# Patient Record
Sex: Male | Born: 1965 | Race: White | Hispanic: No | Smoking: Never smoker
Health system: Southern US, Community
[De-identification: ages and names within clinical notes are randomized; demographics above are authoritative.]

## PROBLEM LIST (undated history)

## (undated) DIAGNOSIS — Z9861 Coronary angioplasty status: Secondary | ICD-10-CM

## (undated) DIAGNOSIS — I340 Nonrheumatic mitral (valve) insufficiency: Secondary | ICD-10-CM

## (undated) DIAGNOSIS — K219 Gastro-esophageal reflux disease without esophagitis: Secondary | ICD-10-CM

## (undated) DIAGNOSIS — I2510 Atherosclerotic heart disease of native coronary artery without angina pectoris: Secondary | ICD-10-CM

## (undated) DIAGNOSIS — M109 Gout, unspecified: Secondary | ICD-10-CM

## (undated) DIAGNOSIS — E785 Hyperlipidemia, unspecified: Secondary | ICD-10-CM

## (undated) HISTORY — DX: Hyperlipidemia, unspecified: E78.5

## (undated) HISTORY — DX: Gout, unspecified: M10.9

## (undated) HISTORY — DX: Gastro-esophageal reflux disease without esophagitis: K21.9

---

## 2010-08-09 ENCOUNTER — Ambulatory Visit: Payer: Self-pay | Admitting: Family Medicine

## 2012-03-29 ENCOUNTER — Ambulatory Visit: Payer: Self-pay | Admitting: Family Medicine

## 2014-11-07 ENCOUNTER — Other Ambulatory Visit: Payer: Self-pay | Admitting: Family Medicine

## 2014-11-07 DIAGNOSIS — K219 Gastro-esophageal reflux disease without esophagitis: Secondary | ICD-10-CM

## 2014-12-24 ENCOUNTER — Ambulatory Visit (INDEPENDENT_AMBULATORY_CARE_PROVIDER_SITE_OTHER): Payer: BLUE CROSS/BLUE SHIELD | Admitting: Family Medicine

## 2014-12-24 ENCOUNTER — Encounter: Payer: Self-pay | Admitting: Family Medicine

## 2014-12-24 VITALS — BP 130/62 | HR 72 | Ht 74.0 in | Wt 242.0 lb

## 2014-12-24 DIAGNOSIS — M10071 Idiopathic gout, right ankle and foot: Secondary | ICD-10-CM

## 2014-12-24 MED ORDER — INDOMETHACIN 50 MG PO CAPS: 50.0000 mg | ORAL_CAPSULE | Freq: Three times a day (TID) | ORAL | Status: DC | PRN

## 2014-12-24 NOTE — Patient Instructions (Signed)

## 2014-12-24 NOTE — Progress Notes (Signed)
Name: Rodney Briggs   MRN: [id number]    DOB: Jul 21, 1965   Date:12/24/2014       Progress Note  Subjective  Chief Complaint  Chief Complaint  Patient presents with  . Gout    R) foot    Leg Pain  The incident occurred more than 1 week ago. There was no injury mechanism. The pain is present in the right foot. The quality of the pain is described as aching. The pain is at a severity of 5/10. The pain is moderate. The pain has been fluctuating since onset. Pertinent negatives include no inability to bear weight, loss of motion, loss of sensation, muscle weakness, numbness or tingling. The symptoms are aggravated by weight bearing. He has tried NSAIDs (started Monday) for the symptoms. The treatment provided moderate relief.    No problem-specific assessment & plan notes found for this encounter.   Past Medical History  Diagnosis Date  . GERD (gastroesophageal reflux disease)   . Gout     History reviewed. No pertinent past surgical history.  Family History  Problem Relation Age of Onset  . Heart disease Mother   . Stroke Mother   . Heart disease Father     Social History   Social History  . Marital Status: Married    Spouse Name: N/A  . Number of Children: N/A  . Years of Education: N/A   Occupational History  . Not on file.   Social History Main Topics  . Smoking status: Never Smoker   . Smokeless tobacco: Not on file  . Alcohol Use: 0.0 oz/week    0 Standard drinks or equivalent per week  . Drug Use: No  . Sexual Activity: Yes   Other Topics Concern  . Not on file   Social History Narrative  . No narrative on file    No Known Allergies   Review of Systems  Constitutional: Negative for fever, chills, weight loss and malaise/fatigue.  HENT: Negative for ear discharge, ear pain and sore throat.   Eyes: Negative for blurred vision.  Respiratory: Negative for cough, sputum production, shortness of breath and wheezing.   Cardiovascular: Negative for chest  pain, palpitations and leg swelling.  Gastrointestinal: Negative for heartburn, nausea, abdominal pain, diarrhea, constipation, blood in stool and melena.  Genitourinary: Negative for dysuria, urgency, frequency and hematuria.  Musculoskeletal: Negative for myalgias, back pain, joint pain and neck pain.  Skin: Negative for rash.  Neurological: Negative for dizziness, tingling, sensory change, focal weakness, numbness and headaches.  Endo/Heme/Allergies: Negative for environmental allergies and polydipsia. Does not bruise/bleed easily.  Psychiatric/Behavioral: Negative for depression and suicidal ideas. The patient is not nervous/anxious and does not have insomnia.      Objective  Filed Vitals:   12/24/14 1030  BP: 130/62  Pulse: 72  Height: 6\' 2"  (1.88 m)  Weight: 242 lb (109.77 kg)    Physical Exam  Constitutional: He is oriented to person, place, and time and well-developed, well-nourished, and in no distress.  HENT:  Head: Normocephalic.  Right Ear: External ear normal.  Left Ear: External ear normal.  Nose: Nose normal.  Mouth/Throat: Oropharynx is clear and moist.  Eyes: Conjunctivae and EOM are normal. Pupils are equal, round, and reactive to light. Right eye exhibits no discharge. Left eye exhibits no discharge. No scleral icterus.  Neck: Normal range of motion. Neck supple. No JVD present. No tracheal deviation present. No thyromegaly present.  Cardiovascular: Normal rate, regular rhythm, normal heart sounds and intact  distal pulses.  Exam reveals no gallop and no friction rub.   No murmur heard. Pulmonary/Chest: Breath sounds normal. No respiratory distress. He has no wheezes. He has no rales.  Abdominal: Soft. Bowel sounds are normal. He exhibits no mass. There is no hepatosplenomegaly. There is no tenderness. There is no rebound, no guarding and no CVA tenderness.  Musculoskeletal: Normal range of motion. He exhibits no edema or tenderness.  Lymphadenopathy:    He has  no cervical adenopathy.  Neurological: He is alert and oriented to person, place, and time. He has normal sensation, normal strength, normal reflexes and intact cranial nerves. No cranial nerve deficit.  Skin: Skin is warm. No rash noted.  Psychiatric: Mood and affect normal.  Nursing note and vitals reviewed.     Assessment & Plan  Problem List Items Addressed This Visit    None    Visit Diagnoses    Acute idiopathic gout of right foot    -  Primary    Relevant Medications    indomethacin (INDOCIN) 50 MG capsule         Dr. [facility]  12/24/2014

## 2015-05-08 ENCOUNTER — Other Ambulatory Visit: Payer: Self-pay | Admitting: Family Medicine

## 2015-07-21 ENCOUNTER — Encounter: Payer: Self-pay | Admitting: Pulmonary Disease

## 2015-07-21 ENCOUNTER — Emergency Department: Payer: BLUE CROSS/BLUE SHIELD

## 2015-07-21 ENCOUNTER — Emergency Department
Admission: EM | Admit: 2015-07-21 | Discharge: 2015-07-21 | Disposition: A | Discharge status: Home / Self Care | Payer: BLUE CROSS/BLUE SHIELD | Attending: Emergency Medicine | Admitting: Emergency Medicine

## 2015-07-21 ENCOUNTER — Encounter: Payer: Self-pay | Admitting: Emergency Medicine

## 2015-07-21 DIAGNOSIS — R079 Chest pain, unspecified: Secondary | ICD-10-CM | POA: Insufficient documentation

## 2015-07-21 DIAGNOSIS — R6884 Jaw pain: Secondary | ICD-10-CM | POA: Diagnosis present

## 2015-07-21 LAB — CBC
HEMATOCRIT: 44 % (ref 40.0–52.0)
HEMOGLOBIN: 15 g/dL (ref 13.0–18.0)
MCH: 29.6 pg (ref 26.0–34.0)
MCHC: 34.1 g/dL (ref 32.0–36.0)
MCV: 86.8 fL (ref 80.0–100.0)
Platelets: 192 10*3/uL (ref 150–440)
RBC: 5.07 MIL/uL (ref 4.40–5.90)
RDW: 13.1 % (ref 11.5–14.5)
WBC: 5.7 10*3/uL (ref 3.8–10.6)

## 2015-07-21 LAB — BASIC METABOLIC PANEL
Anion gap: 6 (ref 5–15)
BUN: 11 mg/dL (ref 6–20)
CHLORIDE: 106 mmol/L (ref 101–111)
CO2: 25 mmol/L (ref 22–32)
Calcium: 9.1 mg/dL (ref 8.9–10.3)
Creatinine, Ser: 1.01 mg/dL (ref 0.61–1.24)
GFR calc non Af Amer: >60 mL/min (ref >60)
Glucose, Bld: 92 mg/dL (ref 65–99)
POTASSIUM: 4.2 mmol/L (ref 3.5–5.1)
SODIUM: 137 mmol/L (ref 135–145)

## 2015-07-21 LAB — TROPONIN I
Troponin I: 0.03 ng/mL (ref <0.031)
Troponin I: <0.03 ng/mL (ref <0.031)

## 2015-07-21 MED ORDER — ASPIRIN 81 MG PO CHEW
324.0000 mg | CHEWABLE_TABLET | Freq: Once | ORAL | Status: AC
  Administered 2015-07-21: 324 mg via ORAL
  Filled 2015-07-21: qty 4

## 2015-07-21 MED ORDER — ASPIRIN EC 325 MG PO TBEC: 325.0000 mg | DELAYED_RELEASE_TABLET | Freq: Every day | ORAL | Status: DC

## 2015-07-21 NOTE — ED Provider Notes (Addendum)
[facility] Emergency Department Provider Note  ____________________________________________  Time seen: 9:40 AM  I have reviewed the triage vital signs and the nursing notes.   HISTORY  Chief Complaint Jaw Pain and Extremity Weakness    HPI Rodney Briggs is a 50 y.o. male who complains of upper chest pain and jaw pain and left arm pain over the past 4 days. It's intermittent lasting about 2 hours at a time. 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 Nonsmoker. No known medical problems except for acid reflux. No history of early CAD in his family. Pain is moderate intensity at its worse. Most recent episode of pain resolved one hour prior to arrival in the emergency department. Pain started after patient was opening a bucket of chlorine for his pool and accidentally took a deep breath of the strong fumes. He felt like it burned his nose and throat, and when he breathes it feels like he has a burning pain in his airway. Denies shortness of breath.   Past Medical History  Diagnosis Date  . GERD (gastroesophageal reflux disease)   . Gout      There are no active problems to display for this patient.    History reviewed. No pertinent past surgical history.   Current Outpatient Rx  Name  Route  Sig  Dispense  Refill  . omeprazole (PRILOSEC) 40 MG capsule      TAKE 1 CAPSULE BY MOUTH DAILY   90 capsule   0   . aspirin EC 325 MG tablet   Oral   Take 1 tablet (325 mg total) by mouth daily.   30 tablet   0   . indomethacin (INDOCIN) 50 MG capsule   Oral   Take 1 capsule (50 mg total) by mouth 3 (three) times daily as needed for moderate pain.   30 capsule   1      Allergies Review of patient's allergies indicates no known allergies.   Family History  Problem Relation Age of Onset  . Heart disease Mother   . Stroke Mother   . Heart  disease Father     Social History Social History  Substance Use Topics  . Smoking status: Never Smoker   . Smokeless tobacco: None  . Alcohol Use: 0.0 oz/week    0 Standard drinks or equivalent per week    Review of Systems  Constitutional:   No fever or chills.  Eyes:   No vision changes.  ENT:   No sore throat. No rhinorrhea. Cardiovascular:   Positive as above chest pain. Respiratory:   No dyspnea or cough. Gastrointestinal:   Negative for abdominal pain, vomiting and diarrhea.  Genitourinary:   Negative for dysuria or difficulty urinating. Musculoskeletal:   Negative for focal pain or swelling Neurological:   Negative for headaches 10-point ROS otherwise negative.  ____________________________________________   PHYSICAL EXAM:  VITAL SIGNS: ED Triage Vitals  Enc Vitals Group     BP 07/21/15 0838 139/88 mmHg     Pulse Rate 07/21/15 0838 67     Resp 07/21/15 0838 18     Temp 07/21/15 0842 97.5 F (36.4 C)     Temp Source 07/21/15 0842 Oral     SpO2 07/21/15 0838 100 %     Weight 07/21/15 0838 232 lb (105.235 kg)     Height 07/21/15 0838 6\' 2"  (1.88 m)  Head Cir --      Peak Flow --      Pain Score 07/21/15 0837 8     Pain Loc --      Pain Edu? --      Excl. in [place]? --     Vital signs reviewed, nursing assessments reviewed.   Constitutional:   Alert and oriented. Well appearing and in no distress. Eyes:   No scleral icterus. No conjunctival pallor. PERRL. EOMI.  No nystagmus. ENT   Head:   Normocephalic and atraumatic.   Nose:   No congestion/rhinnorhea. No septal hematoma   Mouth/Throat:   MMM, no pharyngeal erythema. No peritonsillar mass.    Neck:   No stridor. No SubQ emphysema. No meningismus. Hematological/Lymphatic/Immunilogical:   No cervical lymphadenopathy. Cardiovascular:   RRR. Symmetric bilateral radial and DP pulses.  No murmurs.  Respiratory:   Normal respiratory effort without tachypnea nor retractions. Breath sounds are  clear and equal bilaterally. No wheezes/rales/rhonchi. Gastrointestinal:   Soft and nontender. Non distended. There is no CVA tenderness.  No rebound, rigidity, or guarding. Genitourinary:   deferred Musculoskeletal:   Nontender with normal range of motion in all extremities. No joint effusions.  No lower extremity tenderness.  No edema. Neurologic:   Normal speech and language.  CN 2-10 normal. Motor grossly intact. No gross focal neurologic deficits are appreciated.  Skin:    Skin is warm, dry and intact. No rash noted.  No petechiae, purpura, or bullae.  ____________________________________________    LABS (pertinent positives/negatives) (all labs ordered are listed, but only abnormal results are displayed) Labs Reviewed  BASIC METABOLIC PANEL  CBC  TROPONIN I  TROPONIN I   ____________________________________________   EKG  Interpreted by me Sinus bradycardia rate of 55, normal axis and intervals. Poor R-wave progression in anterior precordial leads. Normal ST segments, isolated T-wave inversion in lead 3 which is nonspecific  ____________________________________________    RADIOLOGY    ____________________________________________   PROCEDURES   ____________________________________________   INITIAL IMPRESSION / ASSESSMENT AND PLAN / ED COURSE  Pertinent labs & imaging results that were available during my care of the patient were reviewed by me and considered in my medical decision making (see chart for details).  Patient well appearing no acute distress, presents with intermittent chest pain over the last 4 days. Not consistent with unstable angina at this time, no evidence of ischemic changes are STEMI on EKG. Vital signs are normal. Labs including troponin 2 are unremarkable. No further pain in the emergency department. Patient given aspirin here, I'll continue him on daily aspirin and have him follow up closely with primary care for further evaluation.  Heart score is low risk     ____________________________________________   FINAL CLINICAL IMPRESSION(S) / ED DIAGNOSES  Final diagnoses:  Nonspecific chest pain       Portions of this note were generated with dragon dictation software. Dictation errors may occur despite best attempts at proofreading.   Carrie Mew, MD 07/21/15 [place], MD 07/21/15 [phone]

## 2015-07-21 NOTE — Discharge Instructions (Signed)
Aspirin and Your Heart  Aspirin is a medicine that affects the way blood clots. Aspirin can be used to help reduce the risk of blood clots, heart attacks, and other heart-related problems.  SHOULD I TAKE ASPIRIN? Your health care provider will help you determine whether it is safe and beneficial for you to take aspirin daily. Taking aspirin daily may be beneficial if you:  Have had a heart attack or chest pain.  Have undergone open heart surgery such as coronary artery bypass surgery (CABG).  Have had coronary angioplasty.  Have experienced a stroke or transient ischemic attack (TIA).  Have peripheral vascular disease (PVD).  Have chronic heart rhythm problems such as atrial fibrillation. ARE THERE ANY RISKS OF TAKING ASPIRIN DAILY? Daily use of aspirin can increase your risk of side effects. Some of these include:  Bleeding. Bleeding problems can be minor or serious. An example of a minor problem is a cut that does not stop bleeding. An example of a more serious problem is stomach bleeding or bleeding into the brain. Your risk of bleeding is increased if you are also taking non-steroidal anti-inflammatory medicine (NSAIDs).  Increased bruising.  Upset stomach.  An allergic reaction. People who have nasal polyps have an increased risk of developing an aspirin allergy. WHAT ARE SOME GUIDELINES I SHOULD FOLLOW WHEN TAKING ASPIRIN?   Take aspirin only as directed by your health care provider. Make sure you understand how much you should take and what form you should take. The two forms of aspirin are:  Non-enteric-coated. This type of aspirin does not have a coating and is absorbed quickly. Non-enteric-coated aspirin is usually recommended for people with chest pain. This type of aspirin also comes in a chewable form.  Enteric-coated. This type of aspirin has a special coating that releases the medicine very slowly. Enteric-coated aspirin causes less stomach upset than non-enteric-coated  aspirin. This type of aspirin should not be chewed or crushed.  Drink alcohol in moderation. Drinking alcohol increases your risk of bleeding. WHEN SHOULD I SEEK MEDICAL CARE?   You have unusual bleeding or bruising.  You have stomach pain.  You have an allergic reaction. Symptoms of an allergic reaction include:  Hives.  Itchy skin.  Swelling of the lips, tongue, or face.  You have ringing in your ears. WHEN SHOULD I SEEK IMMEDIATE MEDICAL CARE?   Your bowel movements are bloody, dark red, or black in color.  You vomit or cough up blood.  You have blood in your urine.  You cough, wheeze, or feel short of breath. If you have any of the following symptoms, this is an emergency. Do not wait to see if the pain will go away. Get medical help at once. Call your local emergency services (911 in the [place] Do not drive yourself to the hospital.  You have severe chest pain, especially if the pain is crushing or pressure-like and spreads to the arms, back, neck, or jaw.  You have stroke-like symptoms, such as:   Loss of vision.   Difficulty talking.   Numbness or weakness on one side of your body.   Numbness or weakness in your arm or leg.   Not thinking clearly or feeling confused.    This information is not intended to replace advice given to you by your health care provider. Make sure you discuss any questions you have with your health care provider.   Document Released: 01/21/2008 Document Revised: 02/28/2014 Document Reviewed: 05/15/2013 Elsevier Interactive Patient Education 2016 Elsevier  Inc. °Nonspecific Chest Pain  °Chest pain can be caused by many different conditions. There is always a chance that your pain could be related to something serious, such as a heart attack or a blood clot in your lungs. Chest pain can also be caused by conditions that are not life-threatening. If you have chest pain, it is very important to follow up with your health care  provider. °CAUSES  °Chest pain can be caused by: °· Heartburn. °· Pneumonia or bronchitis. °· Anxiety or stress. °· Inflammation around your heart (pericarditis) or lung (pleuritis or pleurisy). °· A blood clot in your lung. °· A collapsed lung (pneumothorax). It can develop suddenly on its own (spontaneous pneumothorax) or from trauma to the chest. °· Shingles infection (varicella-zoster virus). °· Heart attack. °· Damage to the bones, muscles, and cartilage that make up your chest wall. This can include: °¨ Bruised bones due to injury. °¨ Strained muscles or cartilage due to frequent or repeated coughing or overwork. °¨ Fracture to one or more ribs. °¨ Sore cartilage due to inflammation (costochondritis). °RISK FACTORS  °Risk factors for chest pain may include: °· Activities that increase your risk for trauma or injury to your chest. °· Respiratory infections or conditions that cause frequent coughing. °· Medical conditions or overeating that can cause heartburn. °· Heart disease or family history of heart disease. °· Conditions or health behaviors that increase your risk of developing a blood clot. °· Having had chicken pox (varicella zoster). °SIGNS AND SYMPTOMS °Chest pain can feel like: °· Burning or tingling on the surface of your chest or deep in your chest. °· Crushing, pressure, aching, or squeezing pain. °· Dull or sharp pain that is worse when you move, cough, or take a deep breath. °· Pain that is also felt in your back, neck, shoulder, or arm, or pain that spreads to any of these areas. °Your chest pain may come and go, or it may stay constant. °DIAGNOSIS °Lab tests or other studies may be needed to find the cause of your pain. Your health care provider may have you take a test called an ambulatory ECG (electrocardiogram). An ECG records your heartbeat patterns at the time the test is performed. You may also have other tests, such as: °· Transthoracic echocardiogram (TTE). During echocardiography,  sound waves are used to create a picture of all of the heart structures and to look at how blood flows through your heart. °· Transesophageal echocardiogram (TEE). This is a more advanced imaging test that obtains images from inside your body. It allows your health care provider to see your heart in finer detail. °· Cardiac monitoring. This allows your health care provider to monitor your heart rate and rhythm in real time. °· Holter monitor. This is a portable device that records your heartbeat and can help to diagnose abnormal heartbeats. It allows your health care provider to track your heart activity for several days, if needed. °· Stress tests. These can be done through exercise or by taking medicine that makes your heart beat more quickly. °· Blood tests. °· Imaging tests. °TREATMENT  °Your treatment depends on what is causing your chest pain. Treatment may include: °· Medicines. These may include: °¨ Acid blockers for heartburn. °¨ Anti-inflammatory medicine. °¨ Pain medicine for inflammatory conditions. °¨ Antibiotic medicine, if an infection is present. °¨ Medicines to dissolve blood clots. °¨ Medicines to treat coronary artery disease. °· Supportive care for conditions that do not require medicines. This may include: °¨ Resting. °¨   Applying heat or cold packs to injured areas.  Limiting activities until pain decreases. HOME CARE INSTRUCTIONS  If you were prescribed an antibiotic medicine, finish it all even if you start to feel better.  Avoid any activities that bring on chest pain.  Do not use any tobacco products, including cigarettes, chewing tobacco, or electronic cigarettes. If you need help quitting, ask your health care provider.  Do not drink alcohol.  Take medicines only as directed by your health care provider.  Keep all follow-up visits as directed by your health care provider. This is important. This includes any further testing if your chest pain does not go away.  If  heartburn is the cause for your chest pain, you may be told to keep your head raised (elevated) while sleeping. This reduces the chance that acid will go from your stomach into your esophagus.  Make lifestyle changes as directed by your health care provider. These may include:  Getting regular exercise. Ask your health care provider to suggest some activities that are safe for you.  Eating a heart-healthy diet. A registered dietitian can help you to learn healthy eating options.  Maintaining a healthy weight.  Managing diabetes, if necessary.  Reducing stress. SEEK MEDICAL CARE IF:  Your chest pain does not go away after treatment.  You have a rash with blisters on your chest.  You have a fever. SEEK IMMEDIATE MEDICAL CARE IF:   Your chest pain is worse.  You have an increasing cough, or you cough up blood.  You have severe abdominal pain.  You have severe weakness.  You faint.  You have chills.  You have sudden, unexplained chest discomfort.  You have sudden, unexplained discomfort in your arms, back, neck, or jaw.  You have shortness of breath at any time.  You suddenly start to sweat, or your skin gets clammy.  You feel nauseous or you vomit.  You suddenly feel light-headed or dizzy.  Your heart begins to beat quickly, or it feels like it is skipping beats. These symptoms may represent a serious problem that is an emergency. Do not wait to see if the symptoms will go away. Get medical help right away. Call your local emergency services (911 in the [place] Do not drive yourself to the hospital.   This information is not intended to replace advice given to you by your health care provider. Make sure you discuss any questions you have with your health care provider.   Document Released: 11/17/2004 Document Revised: 02/28/2014 Document Reviewed: 09/13/2013 Elsevier Interactive Patient Education [employer].

## 2015-07-21 NOTE — ED Notes (Signed)
Pt with left jaw pain and left arm numbness since Friday.

## 2015-07-22 ENCOUNTER — Telehealth: Payer: Self-pay | Admitting: Cardiology

## 2015-07-22 NOTE — Telephone Encounter (Signed)
ED fu patient was seen in for Jaw Tightness  Pt is coming to see [place] on 07/30/15  Pt states he is still having the jaw tightness but ED could not find anything.  Please advise.

## 2015-07-22 NOTE — Telephone Encounter (Signed)
Spoke with patient and he is aware of upcoming appointment on 07/30/15 at 3:00pm. He was recently seen in the ED for jaw tightness and testing at that time didn't show anything. Let him know that when he comes in the physician will review his symptoms and determine if further testing is needed. He verbalized understanding of appointment and had no further questions at this time.

## 2015-07-24 ENCOUNTER — Encounter: Payer: Self-pay | Admitting: Family Medicine

## 2015-07-24 ENCOUNTER — Ambulatory Visit (INDEPENDENT_AMBULATORY_CARE_PROVIDER_SITE_OTHER): Payer: BLUE CROSS/BLUE SHIELD | Admitting: Family Medicine

## 2015-07-24 VITALS — BP 120/92 | HR 80 | Ht 74.0 in | Wt 237.0 lb

## 2015-07-24 DIAGNOSIS — J680 Bronchitis and pneumonitis due to chemicals, gases, fumes and vapors: Secondary | ICD-10-CM | POA: Diagnosis not present

## 2015-07-24 MED ORDER — PREDNISONE 10 MG PO TABS: ORAL_TABLET | ORAL | Status: DC

## 2015-07-24 NOTE — Progress Notes (Signed)
Name: Rodney Briggs   MRN: [id number]    DOB: Dec 16, 1965   Date:07/24/2015       Progress Note  Subjective  Chief Complaint  Chief Complaint  Patient presents with  . Shortness of Breath    at rest and especially on exertion- chest pain in upper mid. Jaw pain on L) side and L) arm pain. Was seen in ER on Tuesday- EKG , labs all came back normal. Has a cardiology appt for next week    Shortness of Breath This is a new problem. The current episode started in the past 7 days. The problem occurs daily. The problem has been waxing and waning. Pertinent negatives include no abdominal pain, chest pain, claudication, coryza, ear pain, fever, headaches, hemoptysis, leg pain, leg swelling, neck pain, orthopnea, PND, rash, rhinorrhea, sore throat, sputum production, swollen glands, syncope, vomiting or wheezing. The symptoms are aggravated by fumes. He has tried nothing for the symptoms. The treatment provided mild relief. There is no history of asthma, bronchiolitis, chronic lung disease, COPD, DVT or PE.    No problem-specific assessment & plan notes found for this encounter.   Past Medical History  Diagnosis Date  . GERD (gastroesophageal reflux disease)   . Gout     History reviewed. No pertinent past surgical history.  Family History  Problem Relation Age of Onset  . Heart disease Mother   . Stroke Mother   . Heart disease Father     Social History   Social History  . Marital Status: Married    Spouse Name: N/A  . Number of Children: N/A  . Years of Education: N/A   Occupational History  . Not on file.   Social History Main Topics  . Smoking status: Never Smoker   . Smokeless tobacco: Not on file  . Alcohol Use: 0.0 oz/week    0 Standard drinks or equivalent per week  . Drug Use: No  . Sexual Activity: Yes   Other Topics Concern  . Not on file   Social History Narrative    No Known Allergies   Review of Systems  Constitutional: Negative for fever, chills,  weight loss and malaise/fatigue.  HENT: Negative for ear discharge, ear pain, rhinorrhea and sore throat.   Eyes: Negative for blurred vision.  Respiratory: Positive for shortness of breath. Negative for cough, hemoptysis, sputum production and wheezing.   Cardiovascular: Negative for chest pain, palpitations, orthopnea, claudication, leg swelling, syncope and PND.  Gastrointestinal: Negative for heartburn, nausea, vomiting, abdominal pain, diarrhea, constipation, blood in stool and melena.  Genitourinary: Negative for dysuria, urgency, frequency and hematuria.  Musculoskeletal: Negative for myalgias, back pain, joint pain and neck pain.  Skin: Negative for rash.  Neurological: Negative for dizziness, tingling, sensory change, focal weakness and headaches.  Endo/Heme/Allergies: Negative for environmental allergies and polydipsia. Does not bruise/bleed easily.  Psychiatric/Behavioral: Negative for depression and suicidal ideas. The patient is not nervous/anxious and does not have insomnia.      Objective  Filed Vitals:   07/24/15 1534  BP: 120/92  Pulse: 80  Height: 6\' 2"  (1.88 m)  Weight: 237 lb (107.502 kg)    Physical Exam  Constitutional: He is oriented to person, place, and time and well-developed, well-nourished, and in no distress.  HENT:  Head: Normocephalic.  Right Ear: External ear normal.  Left Ear: External ear normal.  Nose: Nose normal.  Mouth/Throat: Oropharynx is clear and moist.  Eyes: Conjunctivae and EOM are normal. Pupils are equal, round, and  reactive to light. Right eye exhibits no discharge. Left eye exhibits no discharge. No scleral icterus.  Neck: Normal range of motion. Neck supple. No JVD present. No tracheal deviation present. No thyromegaly present.  Cardiovascular: Normal rate, regular rhythm, normal heart sounds and intact distal pulses.  Exam reveals no gallop and no friction rub.   No murmur heard. Pulmonary/Chest: Breath sounds normal. No  respiratory distress. He has no wheezes. He has no rales.  Increase E/I  Abdominal: Soft. Bowel sounds are normal. He exhibits no mass. There is no hepatosplenomegaly. There is no tenderness. There is no rebound, no guarding and no CVA tenderness.  Musculoskeletal: Normal range of motion. He exhibits no edema or tenderness.  Lymphadenopathy:    He has no cervical adenopathy.  Neurological: He is alert and oriented to person, place, and time. He has normal sensation, normal strength, normal reflexes and intact cranial nerves. No cranial nerve deficit.  Skin: Skin is warm. No rash noted.  Psychiatric: Mood and affect normal.  Nursing note and vitals reviewed.     Assessment & Plan  Problem List Items Addressed This Visit    None    Visit Diagnoses    Acute pneumonitis due to chemical fumes ([place])    -  Primary    chlorine/symbicort sample given    Relevant Medications    predniSONE (DELTASONE) 10 MG tablet         Dr. [facility]  07/24/2015

## 2015-07-24 NOTE — Patient Instructions (Addendum)
Pneumonitis Pneumonitis is inflammation of the lungs.  CAUSES  Many things can cause pneumonitis. These can include:  1. A bacterial or viral infection. Pneumonitis due to an infection is usually called pneumonia. 2. Work-related exposures, including farm and industrial work. Some substances that can cause pneumonitis include asbestos, silica, inhaled acids, or inhaled chlorine gas.  3. Repeated exposure to bird feathers, bird feces, or other allergens.  4. Medicine such as chemotherapy drugs, certain antibiotics, and some heart medicines.  5. Radiation therapy.  6. Exposure to mold. A hot tub, sauna, or home humidifier can have mold growing in it, even if it looks clean. The mold can be breathed in through water vapor. 7. Breathing (aspirating) stomach contents, food, or liquids into the lungs.  SIGNS AND SYMPTOMS  1. Cough.  2. Shortness of breath or difficulty breathing.  3. Fever.  4. Decreased energy.  5. Decreased appetite.  DIAGNOSIS  To diagnose pneumonitis, your health care provider will do a complete history and physical exam. Various tests may be ordered, such as:   Pulmonary function test.   Chest X-ray.   CT scan of the lungs.   Bronchoscopy.   Lung biopsy.  TREATMENT  Treatment will depend on the cause of the pneumonitis. If the cause is exposure to a substance, avoiding further exposure to that substance will help reduce your symptoms. Possible medical treatments for pneumonitis include:   Corticosteroid medicine to help decrease inflammation in the lungs.   Antibiotic medicine to help fight a bacterial lung infection.   Oxygen therapy if you are having difficulty breathing.  HOME CARE INSTRUCTIONS   Avoid exposure to any substance identified as the cause of your pneumonitis.   If you must continue to work with substances that can cause pneumonitis, wear a mask to protect your lungs.   Only take over-the-counter or prescription medicine as  directed by your health care provider.   Do not smoke.   If you use inhalers, keep them with you at all times.   Follow up with your health care provider as directed.  SEEK IMMEDIATE MEDICAL CARE IF:   You develop new or increased shortness of breath.   You develop a blue color (cyanosis) under your fingernails.   You have a fever.  MAKE SURE YOU:   Understand these instructions.  Will watch your condition.  Will get help right away if you are not doing well or get worse.   This information is not intended to replace advice given to you by your health care provider. Make sure you discuss any questions you have with your health care provider.   Document Released: 07/28/2009 Document Revised: 10/10/2012 Document Reviewed: 07/30/2012 Elsevier Interactive Patient Education 2016 [employer]. How to Use an Inhaler Proper inhaler technique is very important. Good technique ensures that the medicine reaches the lungs. Poor technique results in depositing the medicine on the tongue and back of the throat rather than in the airways. If you do not use the inhaler with good technique, the medicine will not help you. STEPS TO FOLLOW IF USING AN INHALER WITHOUT AN EXTENSION TUBE 8. Remove the cap from the inhaler. 9. If you are using the inhaler for the first time, you will need to prime it. Shake the inhaler for 5 seconds and release four puffs into the air, away from your face. Ask your health care provider or pharmacist if you have questions about priming your inhaler. 10. Shake the inhaler for 5 seconds before each breath  in (inhalation). 11. Position the inhaler so that the top of the canister faces up. 12. Put your index finger on the top of the medicine canister. Your thumb supports the bottom of the inhaler. 13. Open your mouth. 14. Either place the inhaler between your teeth and place your lips tightly around the mouthpiece, or hold the inhaler 1-2 inches away from your open  mouth. If you are unsure of which technique to use, ask your health care provider. 15. Breathe out (exhale) normally and as completely as possible. 16. Press the canister down with your index finger to release the medicine. 17. At the same time as the canister is pressed, inhale deeply and slowly until your lungs are completely filled. This should take 4-6 seconds. Keep your tongue down. 18. Hold the medicine in your lungs for 5-10 seconds (10 seconds is best). This helps the medicine get into the small airways of your lungs. 19. Breathe out slowly, through pursed lips. Whistling is an example of pursed lips. 20. Wait at least 15-30 seconds between puffs. Continue with the above steps until you have taken the number of puffs your health care provider has ordered. Do not use the inhaler more than your health care provider tells you. 21. Replace the cap on the inhaler. 22. Follow the directions from your health care provider or the inhaler insert for cleaning the inhaler. STEPS TO FOLLOW IF USING AN INHALER WITH AN EXTENSION (SPACER) 6. Remove the cap from the inhaler. 7. If you are using the inhaler for the first time, you will need to prime it. Shake the inhaler for 5 seconds and release four puffs into the air, away from your face. Ask your health care provider or pharmacist if you have questions about priming your inhaler. 8. Shake the inhaler for 5 seconds before each breath in (inhalation). 9. Place the open end of the spacer onto the mouthpiece of the inhaler. 10. Position the inhaler so that the top of the canister faces up and the spacer mouthpiece faces you. 11. Put your index finger on the top of the medicine canister. Your thumb supports the bottom of the inhaler and the spacer. 12. Breathe out (exhale) normally and as completely as possible. 13. Immediately after exhaling, place the spacer between your teeth and into your mouth. Close your lips tightly around the spacer. 14. Press the  canister down with your index finger to release the medicine. 15. At the same time as the canister is pressed, inhale deeply and slowly until your lungs are completely filled. This should take 4-6 seconds. Keep your tongue down and out of the way. 16. Hold the medicine in your lungs for 5-10 seconds (10 seconds is best). This helps the medicine get into the small airways of your lungs. Exhale. 17. Repeat inhaling deeply through the spacer mouthpiece. Again hold that breath for up to 10 seconds (10 seconds is best). Exhale slowly. If it is difficult to take this second deep breath through the spacer, breathe normally several times through the spacer. Remove the spacer from your mouth. 18. Wait at least 15-30 seconds between puffs. Continue with the above steps until you have taken the number of puffs your health care provider has ordered. Do not use the inhaler more than your health care provider tells you. 19. Remove the spacer from the inhaler, and place the cap on the inhaler. 20. Follow the directions from your health care provider or the inhaler insert for cleaning the inhaler and spacer.  If you are using different kinds of inhalers, use your quick relief medicine to open the airways 10-15 minutes before using a steroid if instructed to do so by your health care provider. If you are unsure which inhalers to use and the order of using them, ask your health care provider, nurse, or respiratory therapist. If you are using a steroid inhaler, always rinse your mouth with water after your last puff, then gargle and spit out the water. Do not swallow the water. AVOID:  Inhaling before or after starting the spray of medicine. It takes practice to coordinate your breathing with triggering the spray.  Inhaling through the nose (rather than the mouth) when triggering the spray. HOW TO DETERMINE IF YOUR INHALER IS FULL OR NEARLY EMPTY You cannot know when an inhaler is empty by shaking it. A few inhalers are  now being made with dose counters. Ask your health care provider for a prescription that has a dose counter if you feel you need that extra help. If your inhaler does not have a counter, ask your health care provider to help you determine the date you need to refill your inhaler. Write the refill date on a calendar or your inhaler canister. Refill your inhaler 7-10 days before it runs out. Be sure to keep an adequate supply of medicine. This includes making sure it is not expired, and that you have a spare inhaler.  SEEK MEDICAL CARE IF:   Your symptoms are only partially relieved with your inhaler.  You are having trouble using your inhaler.  You have some increase in phlegm. SEEK IMMEDIATE MEDICAL CARE IF:   You feel little or no relief with your inhalers. You are still wheezing and are feeling shortness of breath or tightness in your chest or both.  You have dizziness, headaches, or a fast heart rate.  You have chills, fever, or night sweats.  You have a noticeable increase in phlegm production, or there is blood in the phlegm. MAKE SURE YOU:   Understand these instructions.  Will watch your condition.  Will get help right away if you are not doing well or get worse.   This information is not intended to replace advice given to you by your health care provider. Make sure you discuss any questions you have with your health care provider.   Document Released: 02/05/2000 Document Revised: 11/28/2012 Document Reviewed: 09/06/2012 Elsevier Interactive Patient Education [employer].

## 2015-07-30 ENCOUNTER — Ambulatory Visit (INDEPENDENT_AMBULATORY_CARE_PROVIDER_SITE_OTHER): Payer: BLUE CROSS/BLUE SHIELD | Admitting: Cardiology

## 2015-07-30 ENCOUNTER — Encounter: Payer: Self-pay | Admitting: Cardiology

## 2015-07-30 VITALS — BP 120/82 | HR 80 | Ht 74.0 in | Wt 237.8 lb

## 2015-07-30 DIAGNOSIS — R079 Chest pain, unspecified: Secondary | ICD-10-CM | POA: Diagnosis not present

## 2015-07-30 NOTE — Patient Instructions (Addendum)
Medication Instructions:  Your physician recommends that you continue on your current medications as directed. Please refer to the Current Medication list given to you today.   Labwork: None ordered  Testing/Procedures: Your physician has requested that you have an echocardiogram. Echocardiography is a painless test that uses sound waves to create images of your heart. It provides your doctor with information about the size and shape of your heart and how well your heart's chambers and valves are working. This procedure takes approximately one hour. There are no restrictions for this procedure.  Date & Time: ______________________________________________________  [facility]  Your caregiver has ordered a Stress Test with nuclear imaging. The purpose of this test is to evaluate the blood supply to your heart muscle. Cardiac stress tests are done to find areas of poor blood flow to the heart by determining the extent of coronary artery disease (CAD).   Please note: these test may take anywhere between 2-4 hours to complete  PLEASE REPORT TO [place] AT THE FIRST DESK WILL DIRECT YOU WHERE TO GO  Date of Procedure:__Friday August 14, 2015 at 08:00AM_______  Arrival Time for Procedure:___Arrive at 07:45AM to register___________   PLEASE NOTIFY THE OFFICE AT LEAST 24 HOURS IN ADVANCE IF YOU ARE UNABLE TO [place].  [phone] AND  PLEASE NOTIFY NUCLEAR MEDICINE AT [facility] AT LEAST 24 HOURS IN ADVANCE IF YOU ARE UNABLE TO KEEP YOUR APPOINTMENT. [phone]  How to prepare for your Myoview test:   Do not eat or drink after midnight  No caffeine for 24 hours prior to test  No smoking 24 hours prior to test.  Your medication may be taken with water.  If your doctor stopped a medication because of this test, do not take that medication.  Ladies, please do not wear dresses.  Skirts or pants are appropriate. Please wear a short sleeve  shirt.  No perfume, cologne or lotion.  Wear comfortable walking shoes. No heels!   Follow-Up: Your physician recommends that you schedule a follow-up appointment after testing to review results with Dr. Yvone Neu.  Date & Time: ____________________________________________________________   Any Other Special Instructions Will Be Listed Below (If Applicable).     If you need a refill on your cardiac medications before your next appointment, please call your pharmacy.  Echocardiogram An echocardiogram, or echocardiography, uses sound waves (ultrasound) to produce an image of your heart. The echocardiogram is simple, painless, obtained within a short period of time, and offers valuable information to your health care provider. The images from an echocardiogram can provide information such as:  Evidence of coronary artery disease (CAD).  Heart size.  Heart muscle function.  Heart valve function.  Aneurysm detection.  Evidence of a past heart attack.  Fluid buildup around the heart.  Heart muscle thickening.  Assess heart valve function. LET [facility] CARE PROVIDER KNOW ABOUT:  Any allergies you have.  All medicines you are taking, including vitamins, herbs, eye drops, creams, and over-the-counter medicines.  Previous problems you or members of your family have had with the use of anesthetics.  Any blood disorders you have.  Previous surgeries you have had.  Medical conditions you have.  Possibility of pregnancy, if this applies. BEFORE THE PROCEDURE  No special preparation is needed. Eat and drink normally.  PROCEDURE   In order to produce an image of your heart, gel will be applied to your chest and a wand-like tool (transducer) will be moved over your  chest. The gel will help transmit the sound waves from the transducer. The sound waves will harmlessly bounce off your heart to allow the heart images to be captured in real-time motion. These images will then be  recorded.  You may need an IV to receive a medicine that improves the quality of the pictures. AFTER THE PROCEDURE You may return to your normal schedule including diet, activities, and medicines, unless your health care provider tells you otherwise.   This information is not intended to replace advice given to you by your health care provider. Make sure you discuss any questions you have with your health care provider.   Document Released: 02/05/2000 Document Revised: 02/28/2014 Document Reviewed: 10/15/2012 Elsevier Interactive Patient Education 2016 [place].   Cardiac Nuclear Scanning A cardiac nuclear scan is used to check your heart for problems, such as the following:  A portion of the heart is not getting enough blood.  Part of the heart muscle has died, which happens with a heart attack.  The heart wall is not working normally.  In this test, a radioactive dye (tracer) is injected into your bloodstream. After the tracer has traveled to your heart, a scanning device is used to measure how much of the tracer is absorbed by or distributed to various areas of your heart. LET [facility] CARE PROVIDER KNOW ABOUT:  Any allergies you have.  All medicines you are taking, including vitamins, herbs, eye drops, creams, and over-the-counter medicines.  Previous problems you or members of your family have had with the use of anesthetics.  Any blood disorders you have.  Previous surgeries you have had.  Medical conditions you have.  RISKS AND COMPLICATIONS Generally, this is a safe procedure. However, as with any procedure, problems can occur. Possible problems include:   Serious chest pain.  Rapid heartbeat.  Sensation of warmth in your chest. This usually passes quickly. BEFORE THE PROCEDURE Ask your health care provider about changing or stopping your regular medicines. PROCEDURE This procedure is usually done at a hospital and takes 2-4 hours.  An IV tube is  inserted into one of your veins.  Your health care provider will inject a small amount of radioactive tracer through the tube.  You will then wait for 20-40 minutes while the tracer travels through your bloodstream.  You will lie down on an exam table so images of your heart can be taken. Images will be taken for about 15-20 minutes.  You will exercise on a treadmill or stationary bike. While you exercise, your heart activity will be monitored with an electrocardiogram (ECG), and your blood pressure will be checked.  If you are unable to exercise, you may be given a medicine to make your heart beat faster.  When blood flow to your heart has peaked, tracer will again be injected through the IV tube.  After 20-40 minutes, you will get back on the exam table and have more images taken of your heart.  When the procedure is over, your IV tube will be removed. AFTER THE PROCEDURE  You will likely be able to leave shortly after the test. Unless your health care provider tells you otherwise, you may return to your normal schedule, including diet, activities, and medicines.  Make sure you find out how and when you will get your test results.   This information is not intended to replace advice given to you by your health care provider. Make sure you discuss any questions you have with your health  care provider.   Document Released: 03/04/2004 Document Revised: 02/12/2013 Document Reviewed: 01/16/2013 Elsevier Interactive Patient Education [employer].

## 2015-07-30 NOTE — Progress Notes (Signed)
Cardiology Office Note   Date:  07/30/2015   ID:  Rodney Briggs, DOB 1965/12/24, MRN [id number]  Referring Doctor:  Otilio Miu, MD   Cardiologist:   Wende Bushy, MD   Reason for consultation:  Chief Complaint  Patient presents with  . Weakness    also jaw tightness   Chest pain   History of Present Illness: Rodney Briggs is a 50 y.o. male who presents for Chest pain.  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 After that episode, he started having shortness of breath. But also, he developed chest pain or chest tightness radiating down to the left arm and also to his jaw. This pain is describes as tightness in the center of the chest with presentation down to the left arm and also associated with stabbing pain in the back. Moderate to severe intensity. Lasting minutes at a time progressed over the weekend to the point he went to the ER on Tuesday. In the ER, he had several blood work done and were found to be negative. He was released. He followed up with his PCP and mentioned all the symptoms. By this time his symptoms is completely resolved. Hence cardiology consult.  ROS:  Please see the history of present illness. Aside from mentioned under HPI, all other systems are reviewed and negative.     Past Medical History  Diagnosis Date  . GERD (gastroesophageal reflux disease)   . Gout     History reviewed. No pertinent past surgical history.   reports that he has never smoked. He does not have any smokeless tobacco history on file. He reports that he drinks alcohol. He reports that he does not use illicit drugs.   family history includes Heart disease in his father and mother; Stroke in his mother.   Current Outpatient Prescriptions  Medication Sig Dispense Refill  . aspirin EC 325 MG tablet Take 1 tablet (325 mg total) by mouth daily. 30 tablet 0  . indomethacin (INDOCIN) 50 MG  capsule Take 1 capsule (50 mg total) by mouth 3 (three) times daily as needed for moderate pain. 30 capsule 1  . omeprazole (PRILOSEC) 40 MG capsule TAKE 1 CAPSULE BY MOUTH DAILY 90 capsule 0  . predniSONE (DELTASONE) 10 MG tablet Taper 6,6,6,5,5,5,4,4,3,3,2,2,1,1 53 tablet 1   No current facility-administered medications for this visit.    Allergies: Review of patient's allergies indicates no known allergies.    PHYSICAL EXAM: VS:  BP 120/82 mmHg  Pulse 80  Ht 6\' 2"  (1.88 m)  Wt 237 lb 12.8 oz (107.865 kg)  BMI 30.52 kg/m2 , Body mass index is 30.52 kg/(m^2). Wt Readings from Last 3 Encounters:  07/30/15 237 lb 12.8 oz (107.865 kg)  07/24/15 237 lb (107.502 kg)  07/21/15 232 lb (105.235 kg)    GENERAL:  well developed, well nourished,  obese, not in acute distress HEENT: normocephalic, pink conjunctivae, anicteric sclerae, no xanthelasma, normal dentition, oropharynx clear NECK:  no neck vein engorgement, JVP normal, no hepatojugular reflux, carotid upstroke brisk and symmetric, no bruit, no thyromegaly, no lymphadenopathy LUNGS:  good respiratory effort, clear to auscultation bilaterally CV:  PMI not displaced, no thrills, no lifts, S1 and S2 within normal limits, no palpable S3 or S4, no murmurs, no rubs, no gallops ABD:  Soft, nontender, nondistended, normoactive bowel sounds, no abdominal aortic bruit, no hepatomegaly, no splenomegaly MS: nontender  back, no kyphosis, no scoliosis, no joint deformities EXT:  2+ DP/PT pulses, no edema, no varicosities, no cyanosis, no clubbing SKIN: warm, nondiaphoretic, normal turgor, no ulcers NEUROPSYCH: alert, oriented to person, place, and time, sensory/motor grossly intact, normal mood, appropriate affect  Recent Labs: 07/21/2015: BUN 11; Creatinine, Ser 1.01; Hemoglobin 15.0; Platelets 192; Potassium 4.2; Sodium 137   Lipid Panel No results found for: CHOL, TRIG, HDL, CHOLHDL, VLDL, LDLCALC, LDLDIRECT   Other studies Reviewed:  EKG:    The ekg from 07/30/2015 was personally reviewed by me and it revealed sinus rhythm, 81 BPM. Insignificant Q waves in inferior leads.  Additional studies/ records that were reviewed personally reviewed by me today include: None available   ASSESSMENT AND PLAN: Chest pain, shortness breath Risk factors for CAD include age and gender and family history. Recommend further evaluation with exercise nuclear stress test and echocardiogram.   Current medicines are reviewed at length with the patient today.  The patient does not have concerns regarding medicines.  Labs/ tests ordered today include: Orders Placed This Encounter  Procedures  . EKG 12-Lead    I had a lengthy and detailed discussion with the patient regarding diagnoses, prognosis, diagnostic options, treatment options    I counseled the patient on importance of lifestyle modification including heart healthy diet, regular physical activity    Disposition:   FU with undersigned after tests   Signed, Wende Bushy, MD  07/30/2015 3:40 PM    [place]

## 2015-08-04 ENCOUNTER — Other Ambulatory Visit: Payer: Self-pay | Admitting: Family Medicine

## 2015-08-14 ENCOUNTER — Ambulatory Visit
Admission: RE | Admit: 2015-08-14 | Discharge: 2015-08-14 | Disposition: A | Discharge status: Home / Self Care | Payer: BLUE CROSS/BLUE SHIELD | Source: Ambulatory Visit | Attending: Cardiology | Admitting: Cardiology

## 2015-08-14 ENCOUNTER — Telehealth: Payer: Self-pay | Admitting: Cardiology

## 2015-08-14 ENCOUNTER — Other Ambulatory Visit: Payer: Self-pay | Admitting: Cardiology

## 2015-08-14 ENCOUNTER — Other Ambulatory Visit: Payer: Self-pay

## 2015-08-14 ENCOUNTER — Ambulatory Visit (INDEPENDENT_AMBULATORY_CARE_PROVIDER_SITE_OTHER): Payer: BLUE CROSS/BLUE SHIELD

## 2015-08-14 DIAGNOSIS — R079 Chest pain, unspecified: Secondary | ICD-10-CM | POA: Insufficient documentation

## 2015-08-14 DIAGNOSIS — I510 Cardiac septal defect, acquired: Secondary | ICD-10-CM | POA: Diagnosis not present

## 2015-08-14 DIAGNOSIS — R931 Abnormal findings on diagnostic imaging of heart and coronary circulation: Secondary | ICD-10-CM | POA: Insufficient documentation

## 2015-08-14 DIAGNOSIS — R0789 Other chest pain: Secondary | ICD-10-CM

## 2015-08-14 LAB — ECHOCARDIOGRAM COMPLETE
AOASC: 33 cm
AVLVOTPG: 7 mmHg
E decel time: 236 msec
E/e' ratio: 8.26
FS: 29 % (ref 28–44)
IV/PV OW: 1.01
LA ID, A-P, ES: 42 mm
LA diam end sys: 42 mm
LA vol index: 23.8 mL/m2
LA vol: 57 mL
LADIAMINDEX: 1.75 cm/m2
LAVOLA4C: 40.6 mL
LDCA: 3.14 cm2
LV E/e'average: 8.26
LV PW d: 9.43 mm — AB (ref 0.6–1.1)
LV TDI E'MEDIAL: 10
LVEEMED: 8.26
LVELAT: 9.68 cm/s
LVOT SV: 79 mL
LVOT VTI: 25 cm
LVOT diameter: 20 mm
LVOT peak vel: 133 cm/s
MV Dec: 236
MVPG: 3 mmHg
MVPKAVEL: 83.4 m/s
MVPKEVEL: 80 m/s
PV Reg vel dias: 78.7 cm/s
TAPSE: 32.7 mm
TDI e' lateral: 9.68

## 2015-08-14 LAB — NM MYOCAR MULTI W/SPECT W/WALL MOTION / EF
CHL CUP NUCLEAR SDS: 0
CHL CUP NUCLEAR SSS: 4
CHL CUP STRESS STAGE 1 DBP: 79 mmHg
CHL CUP STRESS STAGE 1 SBP: 169 mmHg
CHL CUP STRESS STAGE 3 SPEED: 3.4 mph
CHL CUP STRESS STAGE 4 GRADE: 0 %
CHL CUP STRESS STAGE 4 HR: 130 {beats}/min
CHL CUP STRESS STAGE 5 DBP: 82 mmHg
CHL CUP STRESS STAGE 5 SBP: 151 mmHg
CHL CUP STRESS STAGE 5 SPEED: 0 mph
CSEPPBP: 134 mmHg
Estimated workload: 10.1 METS
Exercise duration (min): 9 min
Exercise duration (sec): 25 s
LV dias vol: 101 mL (ref 62–150)
LV sys vol: 46 mL
MPHR: 171 {beats}/min
Peak HR: 151 {beats}/min
Percent HR: 89 %
Percent of predicted max HR: 88 %
Rest HR: 73 {beats}/min
SRS: 6
Stage 1 Grade: 10 %
Stage 1 HR: 103 {beats}/min
Stage 1 Speed: 1.7 mph
Stage 2 DBP: 77 mmHg
Stage 2 Grade: 12 %
Stage 2 HR: 123 {beats}/min
Stage 2 SBP: 164 mmHg
Stage 2 Speed: 2.5 mph
Stage 3 DBP: 83 mmHg
Stage 3 Grade: 14 %
Stage 3 HR: 151 {beats}/min
Stage 3 SBP: 134 mmHg
Stage 4 Speed: 0 mph
Stage 5 Grade: 0 %
Stage 5 HR: 88 {beats}/min
TID: 0.89

## 2015-08-14 MED ORDER — NITROGLYCERIN 0.4 MG SL SUBL
0.4000 mg | SUBLINGUAL_TABLET | SUBLINGUAL | Status: DC | PRN
Start: 2015-08-14 — End: 2018-06-01

## 2015-08-14 MED ORDER — TECHNETIUM TC 99M TETROFOSMIN IV KIT
12.0000 | PACK | Freq: Once | INTRAVENOUS | Status: AC | PRN
  Administered 2015-08-14: 13.03 via INTRAVENOUS

## 2015-08-14 MED ORDER — TECHNETIUM TC 99M TETROFOSMIN IV KIT
30.0000 | PACK | Freq: Once | INTRAVENOUS | Status: AC | PRN
  Administered 2015-08-14: 31.78 via INTRAVENOUS

## 2015-08-14 NOTE — Telephone Encounter (Signed)
I called the patient to discuss the results of the stress test. I discussed recommendation of LHCath. We went over benefits and risks. He would like to proceed. I informed him that someone from our office will call him on Monday June 26th to set up procedure. I have instructed him to avoid strenuous activity, ASA 81mg  qd (he is already taking) and NTG SL prn for CP. Pt also advised to call 911 if he develops unrelenting CP. Pt verbalized understanding and agreed with plan.

## 2015-08-17 ENCOUNTER — Encounter: Payer: Self-pay | Admitting: *Deleted

## 2015-08-17 ENCOUNTER — Other Ambulatory Visit (INDEPENDENT_AMBULATORY_CARE_PROVIDER_SITE_OTHER): Payer: BLUE CROSS/BLUE SHIELD | Admitting: *Deleted

## 2015-08-17 ENCOUNTER — Telehealth: Payer: Self-pay | Admitting: *Deleted

## 2015-08-17 DIAGNOSIS — Z01812 Encounter for preprocedural laboratory examination: Secondary | ICD-10-CM

## 2015-08-17 NOTE — Telephone Encounter (Signed)
Left voicemail message for patient to call back to discuss time of procedure.

## 2015-08-17 NOTE — Telephone Encounter (Signed)
Instructed patient on time, location, and instructions for preparing for procedure. He verbalized understanding of all instructions and had no further questions. Catheterization scheduled for 08/19/15 at 09:30AM arriving at 08:30AM to register.

## 2015-08-17 NOTE — Telephone Encounter (Signed)
-----   Message from Wende Bushy, MD sent at 08/14/2015  6:13 PM EDT ----- Recommend LHCath. Rec ASA 81mg  qd, and NTG SL prn for chest pain. Pls rec to call 911 for unrelenting chest pain. (please see staff msg re this). Thank you.

## 2015-08-17 NOTE — Telephone Encounter (Signed)
Called patient to come in for PT/INR to be checked for pre procedure labs. He verbalized understanding and will be coming in to have them done today. Will review cardiac catheterization instructions with him at that time.

## 2015-08-18 ENCOUNTER — Telehealth: Payer: Self-pay | Admitting: Cardiology

## 2015-08-18 LAB — PROTIME-INR
INR: 1 (ref 0.8–1.2)
Prothrombin Time: 10.7 s (ref 9.1–12.0)

## 2015-08-18 NOTE — Telephone Encounter (Signed)
Reviewed instructions for catheterization scheduled tomorrow. Patient verbalized understanding of instructions and had no further questions at this time.

## 2015-08-19 ENCOUNTER — Ambulatory Visit
Admission: RE | Admit: 2015-08-19 | Discharge: 2015-08-20 | Disposition: A | Discharge status: Home / Self Care | Payer: BLUE CROSS/BLUE SHIELD | Source: Ambulatory Visit | Attending: Cardiology | Admitting: Cardiology

## 2015-08-19 ENCOUNTER — Encounter
Admission: RE | Disposition: A | Discharge status: Home / Self Care | Payer: Self-pay | Source: Ambulatory Visit | Attending: Cardiology

## 2015-08-19 DIAGNOSIS — R9439 Abnormal result of other cardiovascular function study: Secondary | ICD-10-CM | POA: Diagnosis present

## 2015-08-19 DIAGNOSIS — Z7952 Long term (current) use of systemic steroids: Secondary | ICD-10-CM | POA: Insufficient documentation

## 2015-08-19 DIAGNOSIS — Z7982 Long term (current) use of aspirin: Secondary | ICD-10-CM | POA: Insufficient documentation

## 2015-08-19 DIAGNOSIS — Z79899 Other long term (current) drug therapy: Secondary | ICD-10-CM | POA: Diagnosis not present

## 2015-08-19 DIAGNOSIS — Z9861 Coronary angioplasty status: Secondary | ICD-10-CM

## 2015-08-19 DIAGNOSIS — I2510 Atherosclerotic heart disease of native coronary artery without angina pectoris: Secondary | ICD-10-CM

## 2015-08-19 DIAGNOSIS — Z8249 Family history of ischemic heart disease and other diseases of the circulatory system: Secondary | ICD-10-CM | POA: Insufficient documentation

## 2015-08-19 DIAGNOSIS — I252 Old myocardial infarction: Secondary | ICD-10-CM | POA: Diagnosis not present

## 2015-08-19 DIAGNOSIS — M109 Gout, unspecified: Secondary | ICD-10-CM | POA: Diagnosis not present

## 2015-08-19 DIAGNOSIS — I340 Nonrheumatic mitral (valve) insufficiency: Secondary | ICD-10-CM | POA: Diagnosis present

## 2015-08-19 DIAGNOSIS — I25110 Atherosclerotic heart disease of native coronary artery with unstable angina pectoris: Secondary | ICD-10-CM | POA: Diagnosis present

## 2015-08-19 DIAGNOSIS — K219 Gastro-esophageal reflux disease without esophagitis: Secondary | ICD-10-CM | POA: Insufficient documentation

## 2015-08-19 DIAGNOSIS — Z951 Presence of aortocoronary bypass graft: Secondary | ICD-10-CM | POA: Insufficient documentation

## 2015-08-19 DIAGNOSIS — R079 Chest pain, unspecified: Secondary | ICD-10-CM | POA: Diagnosis present

## 2015-08-19 DIAGNOSIS — Z823 Family history of stroke: Secondary | ICD-10-CM | POA: Diagnosis not present

## 2015-08-19 DIAGNOSIS — I200 Unstable angina: Secondary | ICD-10-CM | POA: Diagnosis present

## 2015-08-19 HISTORY — DX: Atherosclerotic heart disease of native coronary artery without angina pectoris: I25.10

## 2015-08-19 HISTORY — DX: Coronary angioplasty status: Z98.61

## 2015-08-19 HISTORY — DX: Nonrheumatic mitral (valve) insufficiency: I34.0

## 2015-08-19 HISTORY — PX: CARDIAC CATHETERIZATION: SHX172

## 2015-08-19 SURGERY — LEFT HEART CATH AND CORONARY ANGIOGRAPHY
Anesthesia: Moderate Sedation

## 2015-08-19 MED ORDER — FENTANYL CITRATE (PF) 100 MCG/2ML IJ SOLN
INTRAMUSCULAR | Status: AC
  Filled 2015-08-19: qty 2

## 2015-08-19 MED ORDER — SODIUM CHLORIDE 0.9% FLUSH: 3.0000 mL | INTRAVENOUS | Status: DC | PRN

## 2015-08-19 MED ORDER — SODIUM CHLORIDE 0.9 % IV SOLN: 250.0000 mL | INTRAVENOUS | Status: DC | PRN

## 2015-08-19 MED ORDER — NITROGLYCERIN 0.4 MG SL SUBL: 0.4000 mg | SUBLINGUAL_TABLET | SUBLINGUAL | Status: DC | PRN

## 2015-08-19 MED ORDER — SODIUM CHLORIDE 0.9% FLUSH
3.0000 mL | Freq: Two times a day (BID) | INTRAVENOUS | Status: DC
  Administered 2015-08-19 – 2015-08-20 (×2): 3 mL via INTRAVENOUS

## 2015-08-19 MED ORDER — NITROGLYCERIN 0.4 MG SL SUBL
SUBLINGUAL_TABLET | SUBLINGUAL | Status: AC
  Filled 2015-08-19: qty 1

## 2015-08-19 MED ORDER — MIDAZOLAM HCL 2 MG/2ML IJ SOLN
INTRAMUSCULAR | Status: AC
  Filled 2015-08-19: qty 2

## 2015-08-19 MED ORDER — MIDAZOLAM HCL 2 MG/2ML IJ SOLN
INTRAMUSCULAR | Status: DC | PRN
  Administered 2015-08-19 (×2): 1 mg via INTRAVENOUS

## 2015-08-19 MED ORDER — IOPAMIDOL (ISOVUE-300) INJECTION 61%
INTRAVENOUS | Status: DC | PRN
  Administered 2015-08-19: 280 mL via INTRAVENOUS

## 2015-08-19 MED ORDER — SODIUM CHLORIDE 0.9 % WEIGHT BASED INFUSION: 1.0000 mL/kg/h | INTRAVENOUS | Status: DC

## 2015-08-19 MED ORDER — TICAGRELOR 90 MG PO TABS
ORAL_TABLET | ORAL | Status: AC
  Filled 2015-08-19: qty 2

## 2015-08-19 MED ORDER — NITROGLYCERIN 1 MG/10 ML FOR IR/CATH LAB
INTRA_ARTERIAL | Status: DC | PRN
  Administered 2015-08-19 (×2): 200 ug via INTRACORONARY

## 2015-08-19 MED ORDER — ASPIRIN 81 MG PO CHEW
CHEWABLE_TABLET | ORAL | Status: AC
  Filled 2015-08-19: qty 4

## 2015-08-19 MED ORDER — HEPARIN (PORCINE) IN NACL 2-0.9 UNIT/ML-% IJ SOLN
INTRAMUSCULAR | Status: AC
  Filled 2015-08-19: qty 1000

## 2015-08-19 MED ORDER — SODIUM CHLORIDE 0.9 % WEIGHT BASED INFUSION: 3.0000 mL/kg/h | INTRAVENOUS | Status: DC

## 2015-08-19 MED ORDER — ASPIRIN 81 MG PO CHEW
CHEWABLE_TABLET | ORAL | Status: DC | PRN
  Administered 2015-08-19: 324 mg via ORAL

## 2015-08-19 MED ORDER — TICAGRELOR 90 MG PO TABS
ORAL_TABLET | ORAL | Status: DC | PRN
  Administered 2015-08-19: 180 mg via ORAL

## 2015-08-19 MED ORDER — SODIUM CHLORIDE 0.9% FLUSH: 3.0000 mL | Freq: Two times a day (BID) | INTRAVENOUS | Status: DC

## 2015-08-19 MED ORDER — ATORVASTATIN CALCIUM 80 MG PO TABS
80.0000 mg | ORAL_TABLET | Freq: Every day | ORAL | Status: DC
  Administered 2015-08-19: 80 mg via ORAL
  Filled 2015-08-19 (×2): qty 1

## 2015-08-19 MED ORDER — NITROGLYCERIN 0.4 MG SL SUBL
SUBLINGUAL_TABLET | SUBLINGUAL | Status: DC | PRN
  Administered 2015-08-19: .4 mg via SUBLINGUAL

## 2015-08-19 MED ORDER — NITROGLYCERIN 5 MG/ML IV SOLN
INTRAVENOUS | Status: AC
  Filled 2015-08-19: qty 10

## 2015-08-19 MED ORDER — FENTANYL CITRATE (PF) 100 MCG/2ML IJ SOLN
INTRAMUSCULAR | Status: DC | PRN
  Administered 2015-08-19 (×4): 25 ug via INTRAVENOUS

## 2015-08-19 MED ORDER — MORPHINE SULFATE (PF) 2 MG/ML IV SOLN: 2.0000 mg | INTRAVENOUS | Status: DC | PRN

## 2015-08-19 MED ORDER — BIVALIRUDIN 250 MG IV SOLR
INTRAVENOUS | Status: AC
  Filled 2015-08-19: qty 250

## 2015-08-19 MED ORDER — SODIUM CHLORIDE 0.9 % IV SOLN
INTRAVENOUS | Status: DC
  Administered 2015-08-19: 09:00:00 via INTRAVENOUS

## 2015-08-19 MED ORDER — BIVALIRUDIN BOLUS VIA INFUSION - CUPID
INTRAVENOUS | Status: DC | PRN
  Administered 2015-08-19: 80.625 mg via INTRAVENOUS

## 2015-08-19 MED ORDER — ASPIRIN 81 MG PO CHEW
81.0000 mg | CHEWABLE_TABLET | Freq: Every day | ORAL | Status: DC
  Administered 2015-08-20: 81 mg via ORAL
  Filled 2015-08-19: qty 1

## 2015-08-19 MED ORDER — SODIUM CHLORIDE 0.9 % WEIGHT BASED INFUSION: 3.0000 mL/kg/h | INTRAVENOUS | Status: AC

## 2015-08-19 MED ORDER — SODIUM CHLORIDE 0.9 % IV SOLN
250.0000 mg | INTRAVENOUS | Status: DC | PRN
  Administered 2015-08-19: 1.75 mg/kg/h via INTRAVENOUS

## 2015-08-19 MED ORDER — TICAGRELOR 90 MG PO TABS
90.0000 mg | ORAL_TABLET | Freq: Two times a day (BID) | ORAL | Status: DC
  Administered 2015-08-19 – 2015-08-20 (×2): 90 mg via ORAL
  Filled 2015-08-19 (×2): qty 1

## 2015-08-19 MED ORDER — PANTOPRAZOLE SODIUM 40 MG PO TBEC
40.0000 mg | DELAYED_RELEASE_TABLET | Freq: Every day | ORAL | Status: DC
  Administered 2015-08-19 – 2015-08-20 (×2): 40 mg via ORAL
  Filled 2015-08-19 (×5): qty 1

## 2015-08-19 MED ORDER — ACETAMINOPHEN 325 MG PO TABS: 650.0000 mg | ORAL_TABLET | ORAL | Status: DC | PRN

## 2015-08-19 MED ORDER — VERAPAMIL HCL 2.5 MG/ML IV SOLN
INTRAVENOUS | Status: AC
  Filled 2015-08-19: qty 2

## 2015-08-19 MED ORDER — ONDANSETRON HCL 4 MG/2ML IJ SOLN: 4.0000 mg | Freq: Four times a day (QID) | INTRAMUSCULAR | Status: DC | PRN

## 2015-08-19 MED ORDER — VERAPAMIL HCL 2.5 MG/ML IV SOLN
INTRAVENOUS | Status: DC | PRN
  Administered 2015-08-19: 2.5 mg via INTRA_ARTERIAL

## 2015-08-19 MED ORDER — ASPIRIN 81 MG PO CHEW
81.0000 mg | CHEWABLE_TABLET | ORAL | Status: DC
Start: 2015-08-20 — End: 2015-08-19

## 2015-08-19 SURGICAL SUPPLY — 13 items
BALLN MINITREK RX 2.0X12 (BALLOONS) ×3
BALLOON MINITREK RX 2.0X12 (BALLOONS) ×2 IMPLANT
CATH INFINITI 5FR ANG PIGTAIL (CATHETERS) ×3 IMPLANT
CATH OPTITORQUE JACKY 4.0 5F (CATHETERS) ×3 IMPLANT
CATH VISTA GUIDE 6FR XBLAD3.5 (CATHETERS) ×3 IMPLANT
DEVICE INFLAT 30 PLUS (MISCELLANEOUS) ×3 IMPLANT
DEVICE RAD TR BAND REGULAR (VASCULAR PRODUCTS) ×3 IMPLANT
GLIDESHEATH SLEND A-KIT 6F 22G (SHEATH) ×3 IMPLANT
KIT MANI 3VAL PERCEP (MISCELLANEOUS) ×3 IMPLANT
PACK CARDIAC CATH (CUSTOM PROCEDURE TRAY) ×3 IMPLANT
STENT XIENCE ALPINE RX 2.25X18 (Permanent Stent) ×3 IMPLANT
WIRE RUNTHROUGH .014X180CM (WIRE) ×3 IMPLANT
WIRE SAFE-T 1.5MM-J .035X260CM (WIRE) ×3 IMPLANT

## 2015-08-19 NOTE — H&P (View-Only) (Signed)
Cardiology Office Note   Date:  07/30/2015   ID:  Rodney Briggs, DOB 1965-06-06, MRN [id number]  Referring Doctor:  Otilio Miu, MD   Cardiologist:   Wende Bushy, MD   Reason for consultation:  Chief Complaint  Patient presents with  . Weakness    also jaw tightness   Chest pain   History of Present Illness: Rodney Briggs is a 50 y.o. male who presents for Chest pain.  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 After that episode, he started having shortness of breath. But also, he developed chest pain or chest tightness radiating down to the left arm and also to his jaw. This pain is describes as tightness in the center of the chest with presentation down to the left arm and also associated with stabbing pain in the back. Moderate to severe intensity. Lasting minutes at a time progressed over the weekend to the point he went to the ER on Tuesday. In the ER, he had several blood work done and were found to be negative. He was released. He followed up with his PCP and mentioned all the symptoms. By this time his symptoms is completely resolved. Hence cardiology consult.  ROS:  Please see the history of present illness. Aside from mentioned under HPI, all other systems are reviewed and negative.     Past Medical History  Diagnosis Date  . GERD (gastroesophageal reflux disease)   . Gout     History reviewed. No pertinent past surgical history.   reports that he has never smoked. He does not have any smokeless tobacco history on file. He reports that he drinks alcohol. He reports that he does not use illicit drugs.   family history includes Heart disease in his father and mother; Stroke in his mother.   Current Outpatient Prescriptions  Medication Sig Dispense Refill  . aspirin EC 325 MG tablet Take 1 tablet (325 mg total) by mouth daily. 30 tablet 0  . indomethacin (INDOCIN) 50 MG  capsule Take 1 capsule (50 mg total) by mouth 3 (three) times daily as needed for moderate pain. 30 capsule 1  . omeprazole (PRILOSEC) 40 MG capsule TAKE 1 CAPSULE BY MOUTH DAILY 90 capsule 0  . predniSONE (DELTASONE) 10 MG tablet Taper 6,6,6,5,5,5,4,4,3,3,2,2,1,1 53 tablet 1   No current facility-administered medications for this visit.    Allergies: Review of patient's allergies indicates no known allergies.    PHYSICAL EXAM: VS:  BP 120/82 mmHg  Pulse 80  Ht 6\' 2"  (1.88 m)  Wt 237 lb 12.8 oz (107.865 kg)  BMI 30.52 kg/m2 , Body mass index is 30.52 kg/(m^2). Wt Readings from Last 3 Encounters:  07/30/15 237 lb 12.8 oz (107.865 kg)  07/24/15 237 lb (107.502 kg)  07/21/15 232 lb (105.235 kg)    GENERAL:  well developed, well nourished,  obese, not in acute distress HEENT: normocephalic, pink conjunctivae, anicteric sclerae, no xanthelasma, normal dentition, oropharynx clear NECK:  no neck vein engorgement, JVP normal, no hepatojugular reflux, carotid upstroke brisk and symmetric, no bruit, no thyromegaly, no lymphadenopathy LUNGS:  good respiratory effort, clear to auscultation bilaterally CV:  PMI not displaced, no thrills, no lifts, S1 and S2 within normal limits, no palpable S3 or S4, no murmurs, no rubs, no gallops ABD:  Soft, nontender, nondistended, normoactive bowel sounds, no abdominal aortic bruit, no hepatomegaly, no splenomegaly MS: nontender  back, no kyphosis, no scoliosis, no joint deformities EXT:  2+ DP/PT pulses, no edema, no varicosities, no cyanosis, no clubbing SKIN: warm, nondiaphoretic, normal turgor, no ulcers NEUROPSYCH: alert, oriented to person, place, and time, sensory/motor grossly intact, normal mood, appropriate affect  Recent Labs: 07/21/2015: BUN 11; Creatinine, Ser 1.01; Hemoglobin 15.0; Platelets 192; Potassium 4.2; Sodium 137   Lipid Panel No results found for: CHOL, TRIG, HDL, CHOLHDL, VLDL, LDLCALC, LDLDIRECT   Other studies Reviewed:  EKG:    The ekg from 07/30/2015 was personally reviewed by me and it revealed sinus rhythm, 81 BPM. Insignificant Q waves in inferior leads.  Additional studies/ records that were reviewed personally reviewed by me today include: None available   ASSESSMENT AND PLAN: Chest pain, shortness breath Risk factors for CAD include age and gender and family history. Recommend further evaluation with exercise nuclear stress test and echocardiogram.   Current medicines are reviewed at length with the patient today.  The patient does not have concerns regarding medicines.  Labs/ tests ordered today include: Orders Placed This Encounter  Procedures  . EKG 12-Lead    I had a lengthy and detailed discussion with the patient regarding diagnoses, prognosis, diagnostic options, treatment options    I counseled the patient on importance of lifestyle modification including heart healthy diet, regular physical activity    Disposition:   FU with undersigned after tests   Signed, Wende Bushy, MD  07/30/2015 3:40 PM    [place]

## 2015-08-19 NOTE — Progress Notes (Signed)
Pt resting, no bleeding nor hematoma at right radial site,dressing c/d/i, good pulses bil. Radials, girlfriend present with patient,report called to care nurse on [phone] with orders and plan reviewed, denies complaints.

## 2015-08-19 NOTE — Interval H&P Note (Signed)
History and Physical Interval Note:  08/19/2015 9:39 AM  Rodney Briggs  has presented today for surgery, with the diagnosis of Abnormal stress test - with inferior-inferolateral perfusion defect. The patient had episodes of substernal chest pain radiating to the jaw and shoulder following exposure to chlorine. He was evaluated with a nuclear stress test which suggested inferior inferolateral perfusion defect. He now presents for catheterization. With an abnormal stress test, his chest pain at moderate risk for cardiac etiology would probably be considered Unstable Angina.   There was no ST segment deviation noted during stress.  Defect 1: There is a medium defect of moderate severity present in the basal inferior, basal inferolateral, mid inferior and mid inferolateral location.  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  This is an intermediate risk study.  Nuclear stress EF: 43%.    The various methods of treatment have been discussed with the patient and family. After consideration of risks, benefits and other options for treatment, the patient has consented to  Procedure(s): Left Heart Cath and Coronary Angiography (Left) as a surgical intervention .  The patient's history has been reviewed, patient examined, no change in status, stable for surgery.  I have reviewed the patient's chart and labs.  Questions were answered to the patient's satisfaction.    Cath Lab Visit (complete for each Cath Lab visit)  Clinical Evaluation Leading to the Procedure:   ACS: No.  Non-ACS:    Anginal Classification: CCS III  Anti-ischemic medical therapy: No Therapy  Non-Invasive Test Results: Intermediate-risk stress test findings: cardiac mortality 1-3%/year  Prior CABG: No previous CABG  AUC FOR DIAGNOSTIC CATH: CAD Assessment (Coronary Angiography With or Without Left Heart Catheterization and/or Left  Ventriculography)  Patient Information:   Suspected CAD (No Prior PCI, No Prior CABG, and No Prior Angiogram Showing > = 50% Angiographic Stenosis)   Prior Noninvasive Testing: Stress Test With Imaging (SPECT MPI, Stress Echocardiography, Stress PET, Stress CMR)   Intermediate-risk findings (e.g., 5% to 10% ischemic myocardium on stress SPECT MPI or stress PET, stress-induced wall motion abnormality in a single segment on stress echo or stress CMR)   Pretest Symptom Status: Symptomatic  AUC Score:   A (7)   Indication:   16  AUC FOR PCI - pending Preliminary Findings Ischemic Symptoms? CCS III (Marked limitation of ordinary activity) Anti-ischemic Medical Therapy? No Therapy Non-invasive Test Results? Intermediate-risk stress test findings: cardiac mortality 1-3%/year Prior CABG? No Previous CABG   Patient Information:   1-2V CAD, no prox LAD  U (6)  Indication: 16; Score: 6   Patient Information:   CTO of 1 vessel, no other CAD  U (6)  Indication: 26; Score: 6   Patient Information:   1V CAD with prox LAD  A (7)  Indication: 32; Score: 7   Patient Information:   2V-CAD with prox LAD  A (8)  Indication: 38; Score: 8   Patient Information:   3V-CAD without LMCA  A (8)  Indication: 44; Score: 8   Patient Information:   3V-CAD without LMCA With Abnormal LV systolic function  A (9)  Indication: 48; Score: 9   Patient Information:   LMCA-CAD  A (9)  Indication: 49; Score: 9   Patient Information:   2V-CAD with prox LAD PCI  A (7)  Indication: 62; Score: 7   Patient Information:   2V-CAD with prox LAD CABG  A (8)  Indication: 62; Score: 8  Patient Information:   3V-CAD without LMCA With Low CAD burden(i.e., 3 focal stenoses, low SYNTAX score) PCI  A (7)  Indication: 63; Score: 7   Patient Information:   3V-CAD without LMCA With Low CAD burden(i.e., 3 focal stenoses, low SYNTAX score) CABG  A (9)  Indication: 63; Score:  9   Patient Information:   3V-CAD without LMCA E06c - Intermediate-high CAD burden (i.e., multiple diffuse lesions, presence of CTO, or high SYNTAX score) PCI  U (4)  Indication: 64; Score: 4   Patient Information:   3V-CAD without LMCA E06c - Intermediate-high CAD burden (i.e., multiple diffuse lesions, presence of CTO, or high SYNTAX score) CABG  A (9)  Indication: 64; Score: 9   Patient Information:   LMCA-CAD With Isolated LMCA stenosis  PCI  U (6)  Indication: 65; Score: 6   Patient Information:   LMCA-CAD With Isolated LMCA stenosis  CABG  A (9)  Indication: 65; Score: 9   Patient Information:   LMCA-CAD Additional CAD, low CAD burden (i.e., 1- to 2-vessel additional involvement, low SYNTAX score) PCI  U (5)  Indication: 66; Score: 5   Patient Information:   LMCA-CAD Additional CAD, low CAD burden (i.e., 1- to 2-vessel additional involvement, low SYNTAX score) CABG  A (9)  Indication: 66; Score: 9   Patient Information:   LMCA-CAD Additional CAD, intermediate-high CAD burden (i.e., 3-vessel involvement, presence of CTO, or high SYNTAX score) PCI  I (3)  Indication: 67; Score: 3   Patient Information:   LMCA-CAD Additional CAD, intermediate-high CAD burden (i.e., 3-vessel involvement, presence of CTO, or high SYNTAX score) CABG  A (9)  Indication: 67; Score: 9    [name]

## 2015-08-20 ENCOUNTER — Encounter: Payer: Self-pay | Admitting: Physician Assistant

## 2015-08-20 ENCOUNTER — Encounter: Payer: Self-pay | Admitting: Cardiology

## 2015-08-20 DIAGNOSIS — I25110 Atherosclerotic heart disease of native coronary artery with unstable angina pectoris: Secondary | ICD-10-CM | POA: Diagnosis not present

## 2015-08-20 LAB — BASIC METABOLIC PANEL
Anion gap: 6 (ref 5–15)
BUN: 13 mg/dL (ref 6–20)
CALCIUM: 9.1 mg/dL (ref 8.9–10.3)
CO2: 24 mmol/L (ref 22–32)
CREATININE: 1.01 mg/dL (ref 0.61–1.24)
Chloride: 109 mmol/L (ref 101–111)
GFR calc non Af Amer: >60 mL/min (ref >60)
GLUCOSE: 95 mg/dL (ref 65–99)
Potassium: 4.6 mmol/L (ref 3.5–5.1)
Sodium: 139 mmol/L (ref 135–145)

## 2015-08-20 LAB — CBC
HCT: 43.4 % (ref 40.0–52.0)
Hemoglobin: 14.9 g/dL (ref 13.0–18.0)
MCH: 29.4 pg (ref 26.0–34.0)
MCHC: 34.2 g/dL (ref 32.0–36.0)
MCV: 85.8 fL (ref 80.0–100.0)
PLATELETS: 204 10*3/uL (ref 150–440)
RBC: 5.06 MIL/uL (ref 4.40–5.90)
RDW: 13.3 % (ref 11.5–14.5)
WBC: 6.9 10*3/uL (ref 3.8–10.6)

## 2015-08-20 MED ORDER — TICAGRELOR 90 MG PO TABS: 90.0000 mg | ORAL_TABLET | Freq: Two times a day (BID) | ORAL | Status: DC

## 2015-08-20 MED ORDER — ATORVASTATIN CALCIUM 80 MG PO TABS: 80.0000 mg | ORAL_TABLET | Freq: Every day | ORAL | Status: DC

## 2015-08-20 MED ORDER — CARVEDILOL 3.125 MG PO TABS
3.1250 mg | ORAL_TABLET | Freq: Two times a day (BID) | ORAL | Status: DC
  Administered 2015-08-20: 3.125 mg via ORAL
  Filled 2015-08-20: qty 1

## 2015-08-20 MED ORDER — CARVEDILOL 3.125 MG PO TABS: 3.1250 mg | ORAL_TABLET | Freq: Two times a day (BID) | ORAL | Status: DC

## 2015-08-20 MED ORDER — ASPIRIN 81 MG PO CHEW: 81.0000 mg | CHEWABLE_TABLET | Freq: Every day | ORAL | Status: DC

## 2015-08-20 NOTE — Progress Notes (Signed)
Discharge instructions along with home medication list and vascular instructions gone over with patient. Verbalized that he understood instructions. Printed rx's given to patient. brilinta coupon given to patient as well. No c/o pain no distress noted. Iv removed x2, telemetry removed. Patient discharged home

## 2015-08-20 NOTE — Progress Notes (Signed)
   Please note patient's medication list was not refreshed on his discharge summary at time of discharge. He was discharged on aspirin 81 mg, Brilinta 90 mg bid, Coreg 3.125 mg bid, and Lipitor 80 mg daily. Remaining details per discharge summary.

## 2015-08-20 NOTE — Care Management (Signed)
Patient provided with Brilinta Coupon.

## 2015-08-20 NOTE — Discharge Summary (Signed)
Discharge Summary    Patient ID: [facility]  MRN: [id number], DOB/AGE: 1965/07/12 50 y.o.  Admit Date: 08/19/2015 Discharge Date: 08/20/2015  Primary Care Provider: Otilio Miu, MD Primary Cardiologist: Dr. Yvone Neu, MD  Discharge Diagnoses    Principal Problem:   CAD S/P percutaneous coronary angioplasty Active Problems:   Unstable angina [facility])   Abnormal nuclear stress test   Mild mitral regurgitation   Allergies No Known Allergies  Diagnostic Studies/Procedures    Cardiac cath 08/19/2015:  Coronary Findings    Dominance: Right   Left Main  . Vessel is large.     Left Anterior Descending  . Vessel is large. Vessel is angiographically normal. Tapers to a small moderate caliber apical vessels wraps the apex.   . First Septal Branch   The vessel is small in size.   Marland Kitchen Second Diagonal Branch   The vessel is moderate in size and is angiographically normal. first diagonal branch   . Second Septal Branch   The vessel is small in size.   . Third Diagonal Branch   The vessel is small in size and is angiographically normal. Second Diagonal Branch     Ramus Intermedius   . Ramus lesion, 40% stenosed. Tubular.   . Lateral Ramus Intermedius   The vessel is moderate in size.     Left Circumflex  . Vessel is angiographically normal.   . First Obtuse Marginal Branch   The vessel is small in size.   Marland Kitchen Second Obtuse Marginal Branch   The vessel is small in size.   . Third Obtuse Marginal Branch   The vessel is moderate in size. 3rd Mrg filled by collaterals from 2nd [facility].   [facility] 3rd Mrg to 3rd Mrg lesion, 100% stenosed. 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 PCI: There is no pre-interventional antegrade distal flow (TIMI 0). Pre-stent angioplasty was performed. A drug-eluting stent was placed. Minimum lumen area: 2.4 mm. The strut is apposed. Post-stent angioplasty was  performed. With stent balloon to high atmospheres Lesion length: 15 mm. Maximum pressure: 18 atm. The post-interventional distal flow is normal (TIMI 3). The intervention was successful. No complications occurred at this lesion. CATH VISTA GUIDE 6FR XBLAD3.5 ([id number]) -> WIRE [facility] .[id number] ([id number]) easily cross the lesion into the distal vessel. 2 injections of IC NTG 200 g. One following balloon angioplasty, one following stent inflation.  . Supplies used: BALLOON MINITREK YK 2.0X12; STENT XIENCE ALPINE RX [id number]  . There is no residual stenosis post intervention.       Right Coronary Artery  . Vessel is moderate in size. Vessel is angiographically normal.   . Acute Marginal Branch   The vessel is small in size.   . Right Posterior Descending Artery   The vessel is small in size.   . First Right Posterolateral   The vessel is small in size.   Marland Kitchen Second Right Posterolateral   The vessel is small in size.      Wall Motion                 Left Heart    Left Ventricle The left ventricular size is normal. The left ventricular systolic function is normal. There are wall motion abnormalities in the left ventricle. There are segmental wall motion abnormalities in the left ventricle.   Mitral Valve There is trivial (1+) mitral regurgitation.   Aortic Valve There is  no aortic valve stenosis, and no aortic valve regurgitation.    Coronary Diagrams    Diagnostic Diagram           Post-Intervention Diagram            _____________   History of Present Illness     50 year old male with recently diagnosed CAD s/p PCI/DES to OM3 via diagnostic cath on 08/19/2015 2/2 abnormal stress test on 08/14/15 for chest pain. Also with history of GERD and gout. No known HTN, HLD, or DM. Patient was recently seen in the [facility] on 5/30 for chest pain with radiation to his neck, jaw, and left arm, as well as SOB. He reported having inhaled increased chorline when recently  working on his boat to clean it. He ruled out in the ED and he was sent home with outpatient follow up with his PCP. He saw them on 6/2 s/p resolution of symptoms and was referred to cardiology. He saw Dr. Yvone Neu, MD on 07/30/2015 with the above complaints. He was scheduled for an echo on 08/14/15 that showed an EF of 60-65%, normal wall motion, LV diastolic function was normal, mild MR, LA mildly dilated, RV systolic function was normal, PASP normal. He underwent treadmill Myoview on 08/14/15 which showed a medium defect of moderate severity in the basal inferior, basal inferolateral, mid inferior, and mid inferolateral location. Findings were consistent with prior MI in the LCx distribution with mild per-infarct ischemia. EF was estimated at 43%. Overall, this was in intermediate risk study.    [facility] Course     Consultants: Cardiac rehab  Becuase of the abnormal stress test he underwent diagnostic cardiac cath on 08/19/15 by Dr. Ellyn Hack, MD that showed an occluded ostial OM3 to OM3 felt to be subacute/borderline chronic but with evidence of old clot. He underwent successful PCI/DES with Xience DES 2.25 mm x 18 mm with 0% residual stenosis. Ramus with 40% stenosis. LVSF was normal with RWMA consistent with inferolateral OM branch occlusion. Normal LVEDP. Remaining cath details below. It was recommended he start DAPT for at least 3 months, then can stop aspirin and continue Brilinta. POssibly an acceptable convert to Plavix after one month of Brilinta. Lipitor was added. Lipid panel showed >>>>>>. Post cardiac cath instructions were covered. Status post cardiac cath he did not have any issues overnight. Renal function s/p cath was stable and unremarkable. He ambulated in the hallway without any symptoms. All questions and concerns have been addressed. He was seen by Dr. Rockey Situ, MD and felt to be stable for discharge.  _____________  Discharge Vitals Blood pressure 117/70, pulse 64, temperature 98.3 F (36.8  C), temperature source Oral, resp. rate 18, height 6' 2"  (1.88 m), weight 245 lb (111.131 kg), SpO2 99 %.  Filed Weights   08/19/15 0842 08/19/15 1520  Weight: 237 lb (107.502 kg) 245 lb (111.131 kg)    Labs & Radiologic Studies    CBC  Recent Labs  08/20/15 0415  WBC 6.9  HGB 14.9  HCT 43.4  MCV 85.8  PLT 903   Basic Metabolic Panel  Recent Labs  08/20/15 0415  NA 139  K 4.6  CL 109  CO2 24  GLUCOSE 95  BUN 13  CREATININE 1.01  CALCIUM 9.1   Liver Function Tests No results for input(s): AST, ALT, ALKPHOS, BILITOT, PROT, ALBUMIN in the last 72 hours. No results for input(s): LIPASE, AMYLASE in the last 72 hours. Cardiac Enzymes No results for input(s): CKTOTAL, CKMB, CKMBINDEX,  TROPONINI in the last 72 hours. BNP Invalid input(s): POCBNP D-Dimer No results for input(s): DDIMER in the last 72 hours. Hemoglobin A1C No results for input(s): HGBA1C in the last 72 hours. Fasting Lipid Panel No results for input(s): CHOL, HDL, LDLCALC, TRIG, CHOLHDL, LDLDIRECT in the last 72 hours. Thyroid Function Tests No results for input(s): TSH, T4TOTAL, T3FREE, THYROIDAB in the last 72 hours.  Invalid input(s): FREET3 _____________  Dg Chest 2 View  07/21/2015  CLINICAL DATA:  Four day history of left-sided chest pain radiating into the left arm and left side of the neck. EXAM: CHEST  2 VIEW COMPARISON:  None. FINDINGS: Cardiac silhouette normal in size. Hilar and mediastinal contours unremarkable. Lungs clear. Bronchovascular markings normal. Pulmonary vascularity normal. No visible pleural effusions. No pneumothorax. Minimal to mild degenerative changes involving the thoracic spine. IMPRESSION: No acute cardiopulmonary disease. Electronically Signed   By: Evangeline Dakin M.D.   On: 07/21/2015 09:32   Nm Myocar Multi W/spect W/wall Motion / Ef  08/14/2015   There was no ST segment deviation noted during stress.  Defect 1: There is a medium defect of moderate severity  present in the basal inferior, basal inferolateral, mid inferior and mid inferolateral location.  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  This is an intermediate risk study.  Nuclear stress EF: 43%.    Disposition   Pt is being discharged home today in good condition.  Follow-up Plans & Appointments    Follow-up Information    Follow up with Wende Bushy, MD On 09/01/2015.   Specialty:  Cardiology   Why:  Appointment time: 2 PM   Contact information:   [place] [address] [phone]        Discharge Medications   Current Discharge Medication List    CONTINUE these medications which have NOT CHANGED   Details  aspirin EC 325 MG tablet Take 1 tablet (325 mg total) by mouth daily. Qty: 30 tablet, Refills: 0    nitroGLYCERIN (NITROSTAT) 0.4 MG SL tablet Place 1 tablet (0.4 mg total) under the tongue every 5 (five) minutes as needed for chest pain. Qty: 90 tablet, Refills: 3   Associated Diagnoses: Other chest pain    omeprazole (PRILOSEC) 40 MG capsule TAKE 1 CAPSULE BY MOUTH DAILY Qty: 90 capsule, Refills: 0         Aspirin prescribed at discharge?  Yes High Intensity Statin Prescribed? (Lipitor 40-24m or Crestor 20-417m: Yes Beta Blocker Prescribed? Yes For EF <40%, was ACEI/ARB Prescribed? No: EF > 40% ADP Receptor Inhibitor Prescribed? (i.e. Plavix etc.-Includes Medically Managed Patients): Yes For EF <40%, Aldosterone Inhibitor Prescribed? No: As above Was EF assessed during THIS hospitalization? Yes Was Cardiac Rehab II ordered? (Included Medically managed Patients): Yes   Outstanding Labs/Studies   None  Duration of Discharge Encounter   Greater than 30 minutes including physician time.  SiGladstone LighterHCentral Arkansas Surgical Center LLCPager: ([phone]/29/2017, 8:15 AM

## 2015-08-20 NOTE — Progress Notes (Signed)
e   Patient: Rodney [facility]gs / Admit Date: 08/19/2015 / Date of Encounter: 08/20/2015, 8:09 AM   Subjective: No acute overnight events. S no significant changes on clinical examtatus post cardiac cath with PCI/DEs to OM3. There was residual 40% stenosis of the ramus to be managed medically. Normal LVSF and LVEDP. No further chest pain. No SOB. Has ambulated without issues. Tolerating medications without issues. Post cardiac cath labs unremarkable. BP well controlled.   Review of Systems: Review of Systems  Constitutional: Negative.  Negative for fever, chills, weight loss, malaise/fatigue and diaphoresis.  HENT: Negative for congestion.   Eyes: Negative for discharge and redness.  Respiratory: Negative.  Negative for cough, sputum production, shortness of breath and wheezing.   Cardiovascular: Negative.  Negative for chest pain, palpitations, orthopnea, claudication, leg swelling and PND.  Gastrointestinal: Negative.  Negative for nausea and vomiting.  Musculoskeletal: Negative.  Negative for falls.  Skin: Negative for rash.  Neurological: Negative.  Negative for dizziness, sensory change, speech change, focal weakness, loss of consciousness and weakness.  Endo/Heme/Allergies: Does not bruise/bleed easily.  Psychiatric/Behavioral: Negative.  Negative for substance abuse. The patient is not nervous/anxious.   All other systems reviewed and are negative.   Objective: Telemetry: NSR Physical Exam: Blood pressure 117/70, pulse 64, temperature 98.3 F (36.8 C), temperature source Oral, resp. rate 18, height 6\' 2"  (1.88 m), weight 245 lb (111.131 kg), SpO2 99 %. Body mass index is 31.44 kg/(m^2). General: Well developed, well nourished, in no acute distress. Head: Normocephalic, atraumatic, sclera non-icteric, no xanthomas, nares are without discharge. Neck: Negative for carotid bruits. JVP not elevated. Lungs: Clear bilaterally to auscultation without wheezes, rales, or rhonchi. Breathing  is unlabored. Heart: RRR S1 S2 without murmurs, rubs, or gallops.  Abdomen: Soft, non-tender, non-distended with normoactive bowel sounds. No rebound/guarding. Extremities: No clubbing or cyanosis. No edema. Distal pedal pulses are 2+ and equal bilaterally. Right radial cath site without bleeding, bruising, swelling, erythema, or TTP. Distal pulse 2+.  Neuro: Alert and oriented X 3. Moves all extremities spontaneously. Psych:  Responds to questions appropriately with a normal affect.   Intake/Output Summary (Last 24 hours) at 08/20/15 0809 Last data filed at 08/20/15 0733  Gross per 24 hour  Intake    240 ml  Output   2250 ml  Net  -2010 ml    Inpatient Medications:  . aspirin  81 mg Oral Daily  . atorvastatin  80 mg Oral q1800  . pantoprazole  40 mg Oral Daily  . sodium chloride flush  3 mL Intravenous Q12H  . ticagrelor  90 mg Oral BID   Infusions:  . sodium chloride 100 mL/hr at 08/19/15 0917    Labs:  Recent Labs  08/20/15 0415  NA 139  K 4.6  CL 109  CO2 24  GLUCOSE 95  BUN 13  CREATININE 1.01  CALCIUM 9.1   No results for input(s): AST, ALT, ALKPHOS, BILITOT, PROT, ALBUMIN in the last 72 hours.  Recent Labs  08/20/15 0415  WBC 6.9  HGB 14.9  HCT 43.4  MCV 85.8  PLT 204   No results for input(s): CKTOTAL, CKMB, TROPONINI in the last 72 hours. Invalid input(s): POCBNP No results for input(s): HGBA1C in the last 72 hours.   Weights: Filed Weights   08/19/15 0842 08/19/15 1520  Weight: 237 lb (107.502 kg) 245 lb (111.131 kg)     Radiology/Studies:  Dg Chest 2 View  07/21/2015  CLINICAL DATA:  Four day history of  left-sided chest pain radiating into the left arm and left side of the neck. EXAM: CHEST  2 VIEW COMPARISON:  None. FINDINGS: Cardiac silhouette normal in size. Hilar and mediastinal contours unremarkable. Lungs clear. Bronchovascular markings normal. Pulmonary vascularity normal. No visible pleural effusions. No pneumothorax. Minimal to mild  degenerative changes involving the thoracic spine. IMPRESSION: No acute cardiopulmonary disease. Electronically Signed   By: Evangeline Dakin M.D.   On: 07/21/2015 09:32   Nm Myocar Multi W/spect W/wall Motion / Ef  08/14/2015   There was no ST segment deviation noted during stress.  Defect 1: There is a medium defect of moderate severity present in the basal inferior, basal inferolateral, mid inferior and mid inferolateral location.  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  This is an intermediate risk study.  Nuclear stress EF: 43%.      Assessment and Plan  Principal Problem:   CAD S/P percutaneous coronary angioplasty Active Problems:   Unstable angina (HCC)   Abnormal nuclear stress test   Mild mitral regurgitation    1.CAD s/p PCI/DES to OM3: -Continue DAPT per cardiac cath note with aspirin 81 mg and Brilinta 90 mg bid -Add Coreg 3.125 mg bid per Dr. Ellyn Hack, MD -Continue Lipitor -Cardiac rehab as an outpatient  2. GERD:  -Continue PPI   Signed, Marcille Blanco [facility] Pager: [phone] 08/20/2015, 8:09 AM   Attending Note Patient seen and examined, agree with detailed note above,  Patient presentation and plan discussed on rounds.   No pain overnight, patient reports feeling well, ready to go home Discussed med changes as above No change in clinic exam, Labs reviewed  Follow up with Dr. Ellyn Hack 2 to 3 weeks    Greater than 50% was spent in counseling and coordination of care with patient Total encounter time 25 minutes or more   Signed: Esmond Plants  M.D., Ph.D. [facility]

## 2015-09-01 ENCOUNTER — Encounter: Payer: Self-pay | Admitting: Cardiology

## 2015-09-01 ENCOUNTER — Ambulatory Visit: Payer: BLUE CROSS/BLUE SHIELD | Admitting: Cardiology

## 2015-09-01 ENCOUNTER — Ambulatory Visit (INDEPENDENT_AMBULATORY_CARE_PROVIDER_SITE_OTHER): Payer: BLUE CROSS/BLUE SHIELD | Admitting: Cardiology

## 2015-09-01 VITALS — BP 130/72 | HR 62 | Ht 74.0 in | Wt 236.5 lb

## 2015-09-01 DIAGNOSIS — I2510 Atherosclerotic heart disease of native coronary artery without angina pectoris: Secondary | ICD-10-CM

## 2015-09-01 DIAGNOSIS — E669 Obesity, unspecified: Secondary | ICD-10-CM

## 2015-09-01 NOTE — Patient Instructions (Addendum)
Medication Instructions:  Your physician recommends that you continue on your current medications as directed. Please refer to the Current Medication list given to you today.   Labwork: Your physician recommends that you return for lab work in: 3 months for lipid profile. Do not eat or drink anything after midnight prior to coming in for this test. (added liver panel)   Testing/Procedures: None ordered  Follow-Up: Your physician recommends that you schedule a follow-up appointment in: 3 months with Dr. Yvone Neu.  It was a pleasure seeing you today here in the office. Please do not hesitate to give [place] a call back if you have any further questions. [place], BSN     Any Other Special Instructions Will Be Listed Below (If Applicable).     If you need a refill on your cardiac medications before your next appointment, please call your pharmacy.

## 2015-09-01 NOTE — Progress Notes (Signed)
Cardiology Office Note   Date:  09/01/2015   ID:  AUM TEER, DOB 1965-11-09, MRN [id number]  Referring Doctor:  Otilio Miu, MD   Cardiologist:   Wende Bushy, MD   Reason for consultation:  Chief Complaint  Patient presents with  . other    Follow up from [facility], cardiac cath   History of Present Illness: [facility] is a 50 y.o. male who presents for Follow-up after heart cath.  Patient was found to have an abnormal stress test and it was then set up for a left heart catheterization. He received a DES to OM. Since that time, he has not had any more chest pain or shortness of breath. He has arm pain but he feels that it may be related to a pinched nerve. No issues with bleeding. Patient is very much aware about lifestyle changes that he needs to do. He is guilty of not eating a heart healthy diet.   ROS:  Please see the history of present illness. Aside from mentioned under HPI, all other systems are reviewed and negative.     Past Medical History  Diagnosis Date  . GERD (gastroesophageal reflux disease)   . Gout   . Mild mitral regurgitation     a. echo 08/14/15: EF 60-65%, no RWMA, LV dia fxn nl, mild MR, LA mildly dilated, RV sys fxn nl, PASP nl  . CAD S/P percutaneous coronary angioplasty 08/19/2015    a. treadmill myoview 08/14/15: medium defect of moderate severity in the basal inferior, basal, inferolat, mid inf, and mid inferolat wall. Findings c/w prior MI in LCx dist w/ mild per-infarct ischemia. EF 43%, intermediate risk; b. diagnostic cardiac cath 08/19/15: occluded OM3 s/p PCI/DES with Xience DES 2.25 mm x 18 mm, ramus 40%, inferolateral wall HK c/w occluded OM branch, nl EF, nl LVEDP    Past Surgical History  Procedure Laterality Date  . Cardiac catheterization Left 08/19/2015    Procedure: Left Heart Cath and Coronary Angiography;  Surgeon: Leonie Man, MD;  Location: [facility];  Service: Cardiovascular;  Laterality: Left;  . Cardiac  catheterization N/A 08/19/2015    Procedure: Coronary Stent Intervention;  Surgeon: Leonie Man, MD;  Location: [facility];  Service: Cardiovascular;  Laterality: N/A;     reports that he has never smoked. He does not have any smokeless tobacco history on file. He reports that he drinks alcohol. He reports that he does not use illicit drugs.   family history includes Heart disease in his father and mother; Stroke in his mother.   Current Outpatient Prescriptions  Medication Sig Dispense Refill  . aspirin 81 MG chewable tablet Chew 1 tablet (81 mg total) by mouth daily. 30 tablet 11  . atorvastatin (LIPITOR) 80 MG tablet Take 1 tablet (80 mg total) by mouth daily at 6 PM. 30 tablet 11  . carvedilol (COREG) 3.125 MG tablet Take 1 tablet (3.125 mg total) by mouth 2 (two) times daily with a meal. 60 tablet 11  . indomethacin (INDOCIN) 50 MG capsule Take 50 mg by mouth 3 (three) times daily with meals.    . nitroGLYCERIN (NITROSTAT) 0.4 MG SL tablet Place 1 tablet (0.4 mg total) under the tongue every 5 (five) minutes as needed for chest pain. 90 tablet 3  . omeprazole (PRILOSEC) 40 MG capsule TAKE 1 CAPSULE BY MOUTH DAILY 90 capsule 0  . ticagrelor (BRILINTA) 90 MG TABS tablet Take 1 tablet (90 mg total)  by mouth 2 (two) times daily. 60 tablet 0   No current facility-administered medications for this visit.    Allergies: Review of patient's allergies indicates no known allergies.    PHYSICAL EXAM: VS:  BP 130/72 mmHg  Pulse 62  Ht 6\' 2"  (1.88 m)  Wt 236 lb 8 oz (107.276 kg)  BMI 30.35 kg/m2 , Body mass index is 30.35 kg/(m^2). Wt Readings from Last 3 Encounters:  09/01/15 236 lb 8 oz (107.276 kg)  08/19/15 245 lb (111.131 kg)  07/30/15 237 lb 12.8 oz (107.865 kg)    GENERAL:  well developed, well nourished,  obese, not in acute distress HEENT: normocephalic, pink conjunctivae, anicteric sclerae, no xanthelasma, normal dentition, oropharynx clear NECK:  no neck vein  engorgement, JVP normal, no hepatojugular reflux, carotid upstroke brisk and symmetric, no bruit, no thyromegaly, no lymphadenopathy LUNGS:  good respiratory effort, clear to auscultation bilaterally CV:  PMI not displaced, no thrills, no lifts, S1 and S2 within normal limits, no palpable S3 or S4, no murmurs, no rubs, no gallops. Right radial pulse +2, no bruit ABD:  Soft, nontender, nondistended, normoactive bowel sounds, no abdominal aortic bruit, no hepatomegaly, no splenomegaly MS: nontender back, no kyphosis, no scoliosis, no joint deformities EXT:  2+ DP/PT pulses, no edema, no varicosities, no cyanosis, no clubbing SKIN: warm, nondiaphoretic, normal turgor, no ulcers NEUROPSYCH: alert, oriented to person, place, and time, sensory/motor grossly intact, normal mood, appropriate affect  Recent Labs: 08/20/2015: BUN 13; Creatinine, Ser 1.01; Hemoglobin 14.9; Platelets 204; Potassium 4.6; Sodium 139   Lipid Panel No results found for: CHOL, TRIG, HDL, CHOLHDL, VLDL, LDLCALC, LDLDIRECT   Other studies Reviewed:  EKG:  The ekg from 07/30/2015 was personally reviewed by me and it revealed sinus rhythm, 81 BPM. Insignificant Q waves in inferior leads.  Additional studies/ records that were reviewed personally reviewed by me today include:  Echo 08/14/2015: Left ventricle: The cavity size was normal. Systolic function was  normal. The estimated ejection fraction was in the range of 60%  to 65%. Wall motion was normal; there were no regional wall  motion abnormalities. Left ventricular diastolic function  parameters were normal. - Mitral valve: There was mild regurgitation. - Left atrium: The atrium was mildly dilated. - Right ventricle: Systolic function was normal. - Pulmonary arteries: Systolic pressure was within the normal  range.  Nuclear stress test 08/14/2015: 1. There was no ST segment deviation noted during stress. 2. Defect 1: There is a medium defect of moderate severity  present in the basal inferior, basal inferolateral, mid inferior and mid inferolateral location. 3. 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 4. This is an intermediate risk study. 5. Nuclear stress EF: 43%.  LHC 08/19/2015: 6. Ost 3rd Mrg to 3rd Mrg lesion, 100% stenosed - likely subacute/borderline chronic but with evidence of old clot. Post intervention with Xience DES 2.25 mm 18 mm (2.45 mm), there is a 0% residual stenosis. 7. Ramus lesion, 40% stenosed. 8. The left ventricular systolic function is normal with regional wall motion abnormality consistent with inferolateral OM branch occlusion 9. normal LVEDP   DAPT for minimum of 3 months, can then stop aspirin and continue Brilinta. Would be acceptable to convert to Plavix after one month  Continue other risk factor modification.  The patient is a relatively labor-intensive/physical job. Nasal counseled on low stress activities with no use of the right arm/wrist over the weekend.   ASSESSMENT AND PLAN: CAD status  post DES to OM 3, 08/19/2015 Continue medical therapy: Dual antiplatelet therapy for at least 3 months. After which, we'll continue Brilinta. Continue carvedilol. Continue statin therapy. Check lipid panel and LFTs in 3 months time. Continue risk factor modification.  Obesity Body mass index is 30.35 kg/(m^2).Marland Kitchen Recommend aggressive weight loss through diet and increased physical activity.     Current medicines are reviewed at length with the patient today.  The patient does not have concerns regarding medicines.  Labs/ tests ordered today include:  Orders Placed This Encounter  Procedures  . Lipid Profile  . EKG 12-Lead    I had a lengthy and detailed discussion with the patient regarding diagnoses, prognosis, diagnostic options, treatment options    I counseled the patient on importance of lifestyle modification including heart  healthy diet, regular physical activity    Disposition:   FU with undersigned in 3 months  Signed, Wende Bushy, MD  09/01/2015 12:46 PM    [place]

## 2015-09-01 NOTE — Addendum Note (Signed)
Addended by: Valora Corporal on: 09/01/2015 12:51 PM   Modules accepted: Orders

## 2015-11-08 ENCOUNTER — Other Ambulatory Visit: Payer: Self-pay | Admitting: Family Medicine

## 2015-11-13 ENCOUNTER — Ambulatory Visit (INDEPENDENT_AMBULATORY_CARE_PROVIDER_SITE_OTHER): Payer: BLUE CROSS/BLUE SHIELD | Admitting: Family Medicine

## 2015-11-13 ENCOUNTER — Encounter: Payer: Self-pay | Admitting: Family Medicine

## 2015-11-13 VITALS — BP 120/80 | HR 70 | Ht 74.0 in | Wt 228.0 lb

## 2015-11-13 DIAGNOSIS — M1A00X0 Idiopathic chronic gout, unspecified site, without tophus (tophi): Secondary | ICD-10-CM

## 2015-11-13 DIAGNOSIS — K219 Gastro-esophageal reflux disease without esophagitis: Secondary | ICD-10-CM | POA: Diagnosis not present

## 2015-11-13 MED ORDER — INDOMETHACIN 50 MG PO CAPS: 50.0000 mg | ORAL_CAPSULE | Freq: Three times a day (TID) | ORAL | 11 refills | Status: DC

## 2015-11-13 MED ORDER — OMEPRAZOLE 40 MG PO CPDR: 40.0000 mg | DELAYED_RELEASE_CAPSULE | Freq: Every day | ORAL | 11 refills | Status: DC

## 2015-11-13 NOTE — Progress Notes (Signed)
Name: Rodney Briggs   MRN: [id number]    DOB: September 02, 1965   Date:11/13/2015       Progress Note  Subjective  Chief Complaint  Chief Complaint  Patient presents with  . Gastroesophageal Reflux  . Gout    keeps indomethacin on hand for flare ups    Gastroesophageal Reflux  He reports no abdominal pain, no belching, no chest pain, no choking, no coughing, no dysphagia, no early satiety, no globus sensation, no heartburn, no hoarse voice, no nausea, no sore throat, no stridor or no wheezing. This is a recurrent problem. The current episode started more than 1 year ago. The problem occurs frequently. The problem has been gradually improving. The symptoms are aggravated by certain foods. Pertinent negatives include no anemia, fatigue, melena, muscle weakness, orthopnea or weight loss. There are no known risk factors. He has tried a PPI for the symptoms. The treatment provided mild relief.    No problem-specific Assessment & Plan notes found for this encounter.   Past Medical History:  Diagnosis Date  . CAD S/P percutaneous coronary angioplasty 08/19/2015   a. treadmill myoview 08/14/15: medium defect of moderate severity in the basal inferior, basal, inferolat, mid inf, and mid inferolat wall. Findings c/w prior MI in LCx dist w/ mild per-infarct ischemia. EF 43%, intermediate risk; b. diagnostic cardiac cath 08/19/15: occluded OM3 s/p PCI/DES with Xience DES 2.25 mm x 18 mm, ramus 40%, inferolateral wall HK c/w occluded OM branch, nl EF, nl LVEDP  . GERD (gastroesophageal reflux disease)   . Gout   . Mild mitral regurgitation    a. echo 08/14/15: EF 60-65%, no RWMA, LV dia fxn nl, mild MR, LA mildly dilated, RV sys fxn nl, PASP nl    Past Surgical History:  Procedure Laterality Date  . CARDIAC CATHETERIZATION Left 08/19/2015   Procedure: Left Heart Cath and Coronary Angiography;  Surgeon: Leonie Man, MD;  Location: [facility];  Service: Cardiovascular;  Laterality: Left;  .  CARDIAC CATHETERIZATION N/A 08/19/2015   Procedure: Coronary Stent Intervention;  Surgeon: Leonie Man, MD;  Location: [facility];  Service: Cardiovascular;  Laterality: N/A;    Family History  Problem Relation Age of Onset  . Heart disease Mother   . Stroke Mother   . Heart disease Father     Social History   Social History  . Marital status: Divorced    Spouse name: N/A  . Number of children: N/A  . Years of education: N/A   Occupational History  . Not on file.   Social History Main Topics  . Smoking status: Never Smoker  . Smokeless tobacco: Not on file  . Alcohol use 0.0 oz/week  . Drug use: No  . Sexual activity: Yes   Other Topics Concern  . Not on file   Social History Narrative  . No narrative on file    No Known Allergies   Review of Systems  Constitutional: Negative for chills, fatigue, fever, malaise/fatigue and weight loss.  HENT: Negative for ear discharge, ear pain, hoarse voice and sore throat.   Eyes: Negative for blurred vision.  Respiratory: Negative for cough, sputum production, choking, shortness of breath and wheezing.   Cardiovascular: Negative for chest pain, palpitations and leg swelling.  Gastrointestinal: Negative for abdominal pain, blood in stool, constipation, diarrhea, dysphagia, heartburn, melena and nausea.  Genitourinary: Negative for dysuria, frequency, hematuria and urgency.  Musculoskeletal: Negative for back pain, joint pain, myalgias, muscle weakness and neck  pain.  Skin: Negative for rash.  Neurological: Negative for dizziness, tingling, sensory change, focal weakness and headaches.  Endo/Heme/Allergies: Negative for environmental allergies and polydipsia. Does not bruise/bleed easily.  Psychiatric/Behavioral: Negative for depression and suicidal ideas. The patient is not nervous/anxious and does not have insomnia.      Objective  Vitals:   11/13/15 1429  BP: 120/80  Pulse: 70  Weight: 228 lb (103.4 kg)   Height: 6\' 2"  (1.88 m)    Physical Exam  Constitutional: He is oriented to person, place, and time and well-developed, well-nourished, and in no distress.  HENT:  Head: Normocephalic.  Right Ear: External ear normal.  Left Ear: External ear normal.  Nose: Nose normal.  Mouth/Throat: Oropharynx is clear and moist.  Eyes: Conjunctivae and EOM are normal. Pupils are equal, round, and reactive to light. Right eye exhibits no discharge. Left eye exhibits no discharge. No scleral icterus.  Neck: Normal range of motion. Neck supple. No JVD present. No tracheal deviation present. No thyromegaly present.  Cardiovascular: Normal rate, regular rhythm, normal heart sounds and intact distal pulses.  Exam reveals no gallop and no friction rub.   No murmur heard. Pulmonary/Chest: Breath sounds normal. No respiratory distress. He has no wheezes. He has no rales.  Abdominal: Soft. Bowel sounds are normal. He exhibits no mass. There is no hepatosplenomegaly. There is no tenderness. There is no rebound, no guarding and no CVA tenderness.  Musculoskeletal: Normal range of motion. He exhibits no edema or tenderness.  Lymphadenopathy:    He has no cervical adenopathy.  Neurological: He is alert and oriented to person, place, and time. He has normal sensation, normal strength, normal reflexes and intact cranial nerves. No cranial nerve deficit.  Skin: Skin is warm. No rash noted.  Psychiatric: Mood and affect normal.  Nursing note and vitals reviewed.     Assessment & Plan  Problem List Items Addressed This Visit    None    Visit Diagnoses    Idiopathic chronic gout without tophus, unspecified site    -  Primary   Relevant Medications   indomethacin (INDOCIN) 50 MG capsule   Gastroesophageal reflux disease, esophagitis presence not specified       Relevant Medications   omeprazole (PRILOSEC) 40 MG capsule        Dr. [facility]  11/13/15

## 2015-11-18 ENCOUNTER — Other Ambulatory Visit: Payer: Self-pay

## 2015-11-18 DIAGNOSIS — I2510 Atherosclerotic heart disease of native coronary artery without angina pectoris: Secondary | ICD-10-CM

## 2015-11-18 DIAGNOSIS — Z9861 Coronary angioplasty status: Principal | ICD-10-CM

## 2015-11-19 ENCOUNTER — Other Ambulatory Visit
Admission: RE | Admit: 2015-11-19 | Discharge: 2015-11-19 | Disposition: A | Discharge status: Home / Self Care | Payer: BLUE CROSS/BLUE SHIELD | Source: Ambulatory Visit | Attending: Cardiology | Admitting: Cardiology

## 2015-11-19 ENCOUNTER — Other Ambulatory Visit: Payer: BLUE CROSS/BLUE SHIELD

## 2015-11-19 DIAGNOSIS — I2510 Atherosclerotic heart disease of native coronary artery without angina pectoris: Secondary | ICD-10-CM | POA: Diagnosis not present

## 2015-11-19 DIAGNOSIS — Z9861 Coronary angioplasty status: Secondary | ICD-10-CM | POA: Diagnosis present

## 2015-11-19 LAB — LIPID PANEL
Cholesterol: 162 mg/dL (ref 0–200)
HDL: 53 mg/dL (ref >40)
LDL Cholesterol: 58 mg/dL (ref 0–99)
Total CHOL/HDL Ratio: 3.1 RATIO
Triglycerides: 256 mg/dL — ABNORMAL HIGH (ref <150)
VLDL: 51 mg/dL — ABNORMAL HIGH (ref 0–40)

## 2015-11-19 LAB — HEPATIC FUNCTION PANEL
ALK PHOS: 52 U/L (ref 38–126)
ALT: 21 U/L (ref 17–63)
AST: 24 U/L (ref 15–41)
Albumin: 4.2 g/dL (ref 3.5–5.0)
Total Bilirubin: 0.5 mg/dL (ref 0.3–1.2)
Total Protein: 7 g/dL (ref 6.5–8.1)

## 2015-11-23 ENCOUNTER — Other Ambulatory Visit: Payer: BLUE CROSS/BLUE SHIELD

## 2015-11-24 ENCOUNTER — Ambulatory Visit: Payer: BLUE CROSS/BLUE SHIELD | Admitting: Cardiology

## 2015-12-09 ENCOUNTER — Ambulatory Visit (INDEPENDENT_AMBULATORY_CARE_PROVIDER_SITE_OTHER): Payer: BLUE CROSS/BLUE SHIELD | Admitting: Cardiology

## 2015-12-09 ENCOUNTER — Encounter: Payer: Self-pay | Admitting: Cardiology

## 2015-12-09 VITALS — BP 130/90 | HR 61 | Ht 74.0 in | Wt 240.8 lb

## 2015-12-09 DIAGNOSIS — I2510 Atherosclerotic heart disease of native coronary artery without angina pectoris: Secondary | ICD-10-CM | POA: Diagnosis not present

## 2015-12-09 DIAGNOSIS — E6609 Other obesity due to excess calories: Secondary | ICD-10-CM

## 2015-12-09 DIAGNOSIS — Z23 Encounter for immunization: Secondary | ICD-10-CM | POA: Diagnosis not present

## 2015-12-09 NOTE — Progress Notes (Signed)
Cardiology Office Note   Date:  12/09/2015   ID:  Rodney Briggs, DOB 1966-01-27, MRN [id number]  Referring Doctor:  Otilio Miu, MD   Cardiologist:   Wende Bushy, MD   Reason for consultation:  Chief Complaint  Patient presents with  . Atherosclerosis of native coronary artery of native heart wi   History of Present Illness: Rodney Briggs is a 50 y.o. male who presents for Follow-up for CAD  Patient was found to have an abnormal stress test and it was then set up for a left heart catheterization. He received a DES to OM.   Since that time, he has not had any more chest pain or shortness of breath. He has been doing well, some bruising but no significant bleeding. No recurrence of angina.   He admits to not being able to stick to heart healthy diet. He also reports not being able to exercise on a regular basis. He is aware that he needs to work and lifestyle change.   ROS:  Please see the history of present illness. Aside from mentioned under HPI, all other systems are reviewed and negative.     Past Medical History:  Diagnosis Date  . CAD S/P percutaneous coronary angioplasty 08/19/2015   a. treadmill myoview 08/14/15: medium defect of moderate severity in the basal inferior, basal, inferolat, mid inf, and mid inferolat wall. Findings c/w prior MI in LCx dist w/ mild per-infarct ischemia. EF 43%, intermediate risk; b. diagnostic cardiac cath 08/19/15: occluded OM3 s/p PCI/DES with Xience DES 2.25 mm x 18 mm, ramus 40%, inferolateral wall HK c/w occluded OM branch, nl EF, nl LVEDP  . GERD (gastroesophageal reflux disease)   . Gout   . Mild mitral regurgitation    a. echo 08/14/15: EF 60-65%, no RWMA, LV dia fxn nl, mild MR, LA mildly dilated, RV sys fxn nl, PASP nl    Past Surgical History:  Procedure Laterality Date  . CARDIAC CATHETERIZATION Left 08/19/2015   Procedure: Left Heart Cath and Coronary Angiography;  Surgeon: Leonie Man, MD;  Location: [facility];  Service: Cardiovascular;  Laterality: Left;  . CARDIAC CATHETERIZATION N/A 08/19/2015   Procedure: Coronary Stent Intervention;  Surgeon: Leonie Man, MD;  Location: [facility];  Service: Cardiovascular;  Laterality: N/A;     reports that he has never smoked. He has never used smokeless tobacco. He reports that he drinks alcohol. He reports that he does not use drugs.   family history includes Heart disease in his father and mother; Stroke in his mother.   Current Outpatient Prescriptions  Medication Sig Dispense Refill  . aspirin 81 MG chewable tablet Chew 1 tablet (81 mg total) by mouth daily. 30 tablet 11  . atorvastatin (LIPITOR) 80 MG tablet Take 1 tablet (80 mg total) by mouth daily at 6 PM. 30 tablet 11  . carvedilol (COREG) 3.125 MG tablet Take 1 tablet (3.125 mg total) by mouth 2 (two) times daily with a meal. 60 tablet 11  . indomethacin (INDOCIN) 50 MG capsule Take 50 mg by mouth 3 (three) times daily as needed for mild pain or moderate pain.    . nitroGLYCERIN (NITROSTAT) 0.4 MG SL tablet Place 1 tablet (0.4 mg total) under the tongue every 5 (five) minutes as needed for chest pain. 90 tablet 3  . omeprazole (PRILOSEC) 40 MG capsule Take 1 capsule (40 mg total) by mouth daily. 30 capsule 11  . ticagrelor (BRILINTA) 90  MG TABS tablet Take 1 tablet (90 mg total) by mouth 2 (two) times daily. 60 tablet 0   No current facility-administered medications for this visit.     Allergies: Review of patient's allergies indicates no known allergies.    PHYSICAL EXAM: VS:  BP 130/90   Pulse 61   Ht 6\' 2"  (1.88 m)   Wt 240 lb 12.8 oz (109.2 kg)   SpO2 99%   BMI 30.92 kg/m  , Body mass index is 30.92 kg/m. Wt Readings from Last 3 Encounters:  12/09/15 240 lb 12.8 oz (109.2 kg)  11/13/15 228 lb (103.4 kg)  09/01/15 236 lb 8 oz (107.3 kg)    GENERAL:  well developed, well nourished,  obese, not in acute distress HEENT: normocephalic, pink conjunctivae, anicteric  sclerae, no xanthelasma, normal dentition, oropharynx clear NECK:  no neck vein engorgement, JVP normal, no hepatojugular reflux, carotid upstroke brisk and symmetric, no bruit, no thyromegaly, no lymphadenopathy LUNGS:  good respiratory effort, clear to auscultation bilaterally CV:  PMI not displaced, no thrills, no lifts, S1 and S2 within normal limits, no palpable S3 or S4, no murmurs, no rubs, no gallops. Right radial pulse +2, no bruit ABD:  Soft, nontender, nondistended, normoactive bowel sounds, no abdominal aortic bruit, no hepatomegaly, no splenomegaly MS: nontender back, no kyphosis, no scoliosis, no joint deformities EXT:  2+ DP/PT pulses, no edema, no varicosities, no cyanosis, no clubbing SKIN: warm, nondiaphoretic, normal turgor, no ulcers NEUROPSYCH: alert, oriented to person, place, and time, sensory/motor grossly intact, normal mood, appropriate affect  Recent Labs: 08/20/2015: BUN 13; Creatinine, Ser 1.01; Hemoglobin 14.9; Platelets 204; Potassium 4.6; Sodium 139 11/19/2015: ALT 21   Lipid Panel    Component Value Date/Time   CHOL 162 11/19/2015 0735   TRIG 256 (H) 11/19/2015 0735   HDL 53 11/19/2015 0735   CHOLHDL 3.1 11/19/2015 0735   VLDL 51 (H) 11/19/2015 0735   LDLCALC 58 11/19/2015 0735     Other studies Reviewed:  EKG:  The ekg from 07/30/2015 was personally reviewed by me and it revealed sinus rhythm, 81 BPM. Insignificant Q waves in inferior leads.  EKG from 12/09/2015 was personally reviewed by me. This showed sinus bradycardia 51 BPM. Q waves in inferior leads. No significant change from previous EKG.   Additional studies/ records that were reviewed personally reviewed by me today include:  Echo 08/14/2015: Left ventricle: The cavity size was normal. Systolic function was  normal. The estimated ejection fraction was in the range of 60%  to 65%. Wall motion was normal; there were no regional wall  motion abnormalities. Left ventricular diastolic  function  parameters were normal. - Mitral valve: There was mild regurgitation. - Left atrium: The atrium was mildly dilated. - Right ventricle: Systolic function was normal. - Pulmonary arteries: Systolic pressure was within the normal  range.  Nuclear stress test 08/14/2015: 1. There was no ST segment deviation noted during stress. 2. Defect 1: There is a medium defect of moderate severity present in the basal inferior, basal inferolateral, mid inferior and mid inferolateral location. 3. 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 4. This is an intermediate risk study. 5. Nuclear stress EF: 43%.  LHC 08/19/2015: 6. Ost 3rd Mrg to 3rd Mrg lesion, 100% stenosed - likely subacute/borderline chronic but with evidence of old clot. Post intervention with Xience DES 2.25 mm 18 mm (2.45 mm), there is a 0% residual stenosis. 7. Ramus lesion, 40% stenosed. 8.  The left ventricular systolic function is normal with regional wall motion abnormality consistent with inferolateral OM branch occlusion 9. normal LVEDP   DAPT for minimum of 3 months, can then stop aspirin and continue Brilinta. Would be acceptable to convert to Plavix after one month  Continue other risk factor modification.  The patient is a relatively labor-intensive/physical job. Nasal counseled on low stress activities with no use of the right arm/wrist over the weekend.   ASSESSMENT AND PLAN: CAD status post DES to OM 3, 08/19/2015 Continue medical therapy: Dual antiplatelet therapy for at least 3 months. Patient has been tolerating the DAPT very well. We have discussed continuing on to target 6 months. He agrees with this. We would like to continue DAPT until 02/18/2016, and continue aspirin 81 mg by mouth daily after that.  Continue carvedilol. Continue statin therapy. Discussed importance of lifestyle changes,also avoidance of excessive carbohydrates  in light of his elevated triglycerides. Continue risk factor modification. Patient is also interested in getting the flu shot. He has no known allergies  Obesity Body mass index is 30.92 kg/m.Marland Kitchen Recommend aggressive weight loss through diet and increased physical activity.   Current medicines are reviewed at length with the patient today.  The patient does not have concerns regarding medicines.  Labs/ tests ordered today include:  Orders Placed This Encounter  Procedures  . Flu Vaccine QUAD 36+ mos IM  . EKG 12-Lead    I had a lengthy and detailed discussion with the patient regarding diagnoses, prognosis, diagnostic options, treatment options    I counseled the patient on importance of lifestyle modification including heart healthy diet, regular physical activity .   Disposition:   FU with undersigned in 6 months  Signed, Wende Bushy, MD  12/09/2015 3:32 PM    [place]

## 2015-12-09 NOTE — Patient Instructions (Signed)
Medication Instructions:  Continue Brilinta and Aspirin until February 18, 2016  Flu shot given today.   Follow-Up: Your physician wants you to follow-up in: 6 months with Dr. Yvone Neu. You will receive a reminder letter in the mail two months in advance. If you don't receive a letter, please call our office to schedule the follow-up appointment.  It was a pleasure seeing you today here in the office. Please do not hesitate to give [place] a call back if you have any further questions. [place], BSN

## 2016-06-09 ENCOUNTER — Telehealth: Payer: Self-pay | Admitting: *Deleted

## 2016-06-09 DIAGNOSIS — I2510 Atherosclerotic heart disease of native coronary artery without angina pectoris: Secondary | ICD-10-CM

## 2016-06-09 DIAGNOSIS — Z9861 Coronary angioplasty status: Principal | ICD-10-CM

## 2016-06-09 NOTE — Telephone Encounter (Signed)
Spoke with patient. Patient has upcoming appt with Dr Yvone Neu on 06/22/16. Patient wants to know if he needs lab work to check cholesterol prior to appt as he did the last time.  Will route to Dr Yvone Neu for advice on labs.

## 2016-06-09 NOTE — Telephone Encounter (Signed)
Ok, cmp, fasting lipid panel pls thank you

## 2016-06-09 NOTE — Telephone Encounter (Signed)
Called patient for his 71mth f/u appt , Patient states he gets his fasting labs before his appt. I scheduled the appt in EP[facility] but there is no orders. I was making sure if Dr. Yvone Neu want to get the labs before or if the labs needs to be done during that appt time . Thank you .

## 2016-06-09 NOTE — Telephone Encounter (Signed)
Spoke with patient and let him know that I in fact ordered the labs for him to have done. He already has 2 appointments and has no further questions at this time.

## 2016-06-17 ENCOUNTER — Other Ambulatory Visit: Payer: BLUE CROSS/BLUE SHIELD

## 2016-06-20 ENCOUNTER — Other Ambulatory Visit (INDEPENDENT_AMBULATORY_CARE_PROVIDER_SITE_OTHER): Payer: BLUE CROSS/BLUE SHIELD | Admitting: *Deleted

## 2016-06-20 DIAGNOSIS — I2510 Atherosclerotic heart disease of native coronary artery without angina pectoris: Secondary | ICD-10-CM

## 2016-06-20 DIAGNOSIS — Z9861 Coronary angioplasty status: Secondary | ICD-10-CM

## 2016-06-20 NOTE — Addendum Note (Signed)
Addended by: Britt Bottom on: 06/20/2016 08:14 AM   Modules accepted: Orders

## 2016-06-21 LAB — LIPID PANEL
CHOLESTEROL TOTAL: 168 mg/dL (ref 100–199)
Chol/HDL Ratio: 3.2 ratio (ref 0.0–5.0)
HDL: 52 mg/dL (ref >39)
LDL Calculated: 91 mg/dL (ref 0–99)
Triglycerides: 127 mg/dL (ref 0–149)
VLDL Cholesterol Cal: 25 mg/dL (ref 5–40)

## 2016-06-21 LAB — COMPREHENSIVE METABOLIC PANEL
A/G RATIO: 2 (ref 1.2–2.2)
ALK PHOS: 76 IU/L (ref 39–117)
ALT: 18 IU/L (ref 0–44)
AST: 20 IU/L (ref 0–40)
Albumin: 4.6 g/dL (ref 3.5–5.5)
BUN/Creatinine Ratio: 9 (ref 9–20)
BUN: 9 mg/dL (ref 6–24)
Bilirubin Total: 0.7 mg/dL (ref 0.0–1.2)
CALCIUM: 9.5 mg/dL (ref 8.7–10.2)
CO2: 25 mmol/L (ref 18–29)
Chloride: 102 mmol/L (ref 96–106)
Creatinine, Ser: 0.99 mg/dL (ref 0.76–1.27)
GFR calc Af Amer: 102 mL/min/{1.73_m2} (ref >59)
GFR, EST NON AFRICAN AMERICAN: 88 mL/min/{1.73_m2} (ref >59)
GLUCOSE: 95 mg/dL (ref 65–99)
Globulin, Total: 2.3 g/dL (ref 1.5–4.5)
POTASSIUM: 4.7 mmol/L (ref 3.5–5.2)
Sodium: 141 mmol/L (ref 134–144)
Total Protein: 6.9 g/dL (ref 6.0–8.5)

## 2016-06-22 ENCOUNTER — Ambulatory Visit (INDEPENDENT_AMBULATORY_CARE_PROVIDER_SITE_OTHER): Payer: BLUE CROSS/BLUE SHIELD | Admitting: Cardiology

## 2016-06-22 ENCOUNTER — Encounter: Payer: Self-pay | Admitting: Cardiology

## 2016-06-22 VITALS — BP 128/72 | HR 77 | Ht 74.0 in | Wt 240.2 lb

## 2016-06-22 DIAGNOSIS — E6609 Other obesity due to excess calories: Secondary | ICD-10-CM | POA: Diagnosis not present

## 2016-06-22 DIAGNOSIS — Z9861 Coronary angioplasty status: Secondary | ICD-10-CM | POA: Diagnosis not present

## 2016-06-22 DIAGNOSIS — I2510 Atherosclerotic heart disease of native coronary artery without angina pectoris: Secondary | ICD-10-CM

## 2016-06-22 MED ORDER — CARVEDILOL 3.125 MG PO TABS
3.1250 mg | ORAL_TABLET | Freq: Two times a day (BID) | ORAL | 3 refills | Status: DC
Start: 2016-06-22 — End: 2017-06-16

## 2016-06-22 NOTE — Patient Instructions (Signed)
Medication Instructions:  Your physician has recommended you make the following change in your medication:  1. STOP Brilinta after current supply 2. Refills sent in for Coreg   Labwork: Your physician recommends that you return for lab work in: 3 months. So around first week of August go to [facility] of the hospital and check in at the first desk to have labs done (Take lab slips with you). Make sure not to eat or drink after midnight before going in for those labs to be done. We will call you with those results.   Follow-Up: Your physician wants you to follow-up in: 3 months after labs are done. You will receive a reminder letter in the mail two months in advance. If you don't receive a letter, please call our office to schedule the follow-up appointment.  It was a pleasure seeing you today here in the office. Please do not hesitate to give [place] a call back if you have any further questions. [place], BSN

## 2016-06-22 NOTE — Progress Notes (Signed)
Cardiology Office Note   Date:  06/22/2016   ID:  Rodney Briggs, DOB 1965-10-11, MRN [id number]  Referring Doctor:  Otilio Miu, MD   Cardiologist:   Wende Bushy, MD   Reason for consultation:  Chief Complaint  Patient presents with  . other    6 month follow up. Meds reviewed by the pt. verbally. "doing well."    History of Present Illness: Rodney Briggs is a 51 y.o. male who presents forFollow-up for CAD  Patient was found to have an abnormal stress test and it was then set up for a left heart catheterization. He received a DES to OM.   Since last visit, patient has been doing well. No chest pain or shortness of breath. However, he admits that he has not been following a strict heart healthy diet. Also, during the wintertime he was much less active physically.  ROS:  Please see the history of present illness. Aside from mentioned under HPI, all other systems are reviewed and negative.    Past Medical History:  Diagnosis Date  . CAD S/P percutaneous coronary angioplasty 08/19/2015   a. treadmill myoview 08/14/15: medium defect of moderate severity in the basal inferior, basal, inferolat, mid inf, and mid inferolat wall. Findings c/w prior MI in LCx dist w/ mild per-infarct ischemia. EF 43%, intermediate risk; b. diagnostic cardiac cath 08/19/15: occluded OM3 s/p PCI/DES with Xience DES 2.25 mm x 18 mm, ramus 40%, inferolateral wall HK c/w occluded OM branch, nl EF, nl LVEDP  . GERD (gastroesophageal reflux disease)   . Gout   . Mild mitral regurgitation    a. echo 08/14/15: EF 60-65%, no RWMA, LV dia fxn nl, mild MR, LA mildly dilated, RV sys fxn nl, PASP nl    Past Surgical History:  Procedure Laterality Date  . CARDIAC CATHETERIZATION Left 08/19/2015   Procedure: Left Heart Cath and Coronary Angiography;  Surgeon: Leonie Man, MD;  Location: [facility];  Service: Cardiovascular;  Laterality: Left;  . CARDIAC CATHETERIZATION N/A 08/19/2015   Procedure:  Coronary Stent Intervention;  Surgeon: Leonie Man, MD;  Location: [facility];  Service: Cardiovascular;  Laterality: N/A;     reports that he has never smoked. He has never used smokeless tobacco. He reports that he drinks alcohol. He reports that he does not use drugs.   family history includes Heart disease in his father and mother; Stroke in his mother.   Current Outpatient Prescriptions  Medication Sig Dispense Refill  . aspirin 81 MG chewable tablet Chew 1 tablet (81 mg total) by mouth daily. 30 tablet 11  . atorvastatin (LIPITOR) 80 MG tablet Take 1 tablet (80 mg total) by mouth daily at 6 PM. 30 tablet 11  . carvedilol (COREG) 3.125 MG tablet Take 1 tablet (3.125 mg total) by mouth 2 (two) times daily with a meal. 180 tablet 3  . indomethacin (INDOCIN) 50 MG capsule Take 50 mg by mouth 3 (three) times daily as needed for mild pain or moderate pain.    . nitroGLYCERIN (NITROSTAT) 0.4 MG SL tablet Place 1 tablet (0.4 mg total) under the tongue every 5 (five) minutes as needed for chest pain. 90 tablet 3  . omeprazole (PRILOSEC) 40 MG capsule Take 1 capsule (40 mg total) by mouth daily. 30 capsule 11   No current facility-administered medications for this visit.     Allergies: Patient has no known allergies.    PHYSICAL EXAM: VS:  BP 128/72 (  BP Location: Left Arm, Patient Position: Sitting, Cuff Size: Normal)   Pulse 77   Ht 6\' 2"  (1.88 m)   Wt 240 lb 4 oz (109 kg)   BMI 30.85 kg/m  , Body mass index is 30.85 kg/m. Wt Readings from Last 3 Encounters:  06/22/16 240 lb 4 oz (109 kg)  12/09/15 240 lb 12.8 oz (109.2 kg)  11/13/15 228 lb (103.4 kg)    GENERAL:  well developed, well nourished, obese, not in acute distress HEENT: normocephalic, pink conjunctivae, anicteric sclerae, no xanthelasma, normal dentition, oropharynx clear NECK:  no neck vein engorgement, JVP normal, no hepatojugular reflux, carotid upstroke brisk and symmetric, no bruit, no thyromegaly, no  lymphadenopathy LUNGS:  good respiratory effort, clear to auscultation bilaterally CV:  PMI not displaced, no thrills, no lifts, S1 and S2 within normal limits, no palpable S3 or S4, no murmurs, no rubs, no gallops ABD:  Soft, nontender, nondistended, normoactive bowel sounds, no abdominal aortic bruit, no hepatomegaly, no splenomegaly MS: nontender back, no kyphosis, no scoliosis, no joint deformities EXT:  2+ DP/PT pulses, no edema, no varicosities, no cyanosis, no clubbing SKIN: warm, nondiaphoretic, normal turgor, no ulcers NEUROPSYCH: alert, oriented to person, place, and time, sensory/motor grossly intact, normal mood, appropriate affect    Recent Labs: 08/20/2015: Hemoglobin 14.9; Platelets 204 06/20/2016: ALT 18; BUN 9; Creatinine, Ser 0.99; Potassium 4.7; Sodium 141   Lipid Panel    Component Value Date/Time   CHOL 168 06/20/2016 0813   TRIG 127 06/20/2016 0813   HDL 52 06/20/2016 0813   CHOLHDL 3.2 06/20/2016 0813   CHOLHDL 3.1 11/19/2015 0735   VLDL 51 (H) 11/19/2015 0735   LDLCALC 91 06/20/2016 0813     Other studies Reviewed:  EKG:  The ekg from 07/30/2015 was personally reviewed by me and it revealed sinus rhythm, 81 BPM. Insignificant Q waves in inferior leads.  EKG from 12/09/2015 was personally reviewed by me. This showed sinus bradycardia 51 BPM. Q waves in inferior leads. No significant change from previous EKG.   Additional studies/ records that were reviewed personally reviewed by me today include:  Echo 08/14/2015: Left ventricle: The cavity size was normal. Systolic function was  normal. The estimated ejection fraction was in the range of 60%  to 65%. Wall motion was normal; there were no regional wall  motion abnormalities. Left ventricular diastolic function  parameters were normal. - Mitral valve: There was mild regurgitation. - Left atrium: The atrium was mildly dilated. - Right ventricle: Systolic function was normal. - Pulmonary arteries:  Systolic pressure was within the normal  range.  Nuclear stress test 08/14/2015: 1. There was no ST segment deviation noted during stress. 2. Defect 1: There is a medium defect of moderate severity present in the basal inferior, basal inferolateral, mid inferior and mid inferolateral location. 3. 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 4. This is an intermediate risk study. 5. Nuclear stress EF: 43%.  LHC 08/19/2015: 6. Ost 3rd Mrg to 3rd Mrg lesion, 100% stenosed - likely subacute/borderline chronic but with evidence of old clot. Post intervention with Xience DES 2.25 mm 18 mm (2.45 mm), there is a 0% residual stenosis. 7. Ramus lesion, 40% stenosed. 8. The left ventricular systolic function is normal with regional wall motion abnormality consistent with inferolateral OM branch occlusion 9. normal LVEDP   DAPT for minimum of 3 months, can then stop aspirin and continue Brilinta. Would be acceptable to convert to Plavix  after one month  Continue other risk factor modification.  The patient is a relatively labor-intensive/physical job. Nasal counseled on low stress activities with no use of the right arm/wrist over the weekend.   ASSESSMENT AND PLAN: CAD status post DES to OM 3, 08/19/2015 Continue medical therapy. Patient to finish off suppply off Brilinta. Continue aspirin 81 mg by mouth daily. Refill carvedilol 3.125 mg twice a day. Discussed that LDL has increased to 91, with weight gain of close to 20 pounds. We've discussed options, additional apical therapy versus lifestyle changes. Patient will like to work on diet and lifestyle. Recommend to recheck CMP and lipid panel in 3 months time and follow-up in the office then.  Obesity Body mass index is 30.85 kg/m. Recommend aggressive weight loss through diet and increased physical activity.   Current medicines are reviewed at length with the patient  today.  The patient does not have concerns regarding medicines.  Labs/ tests ordered today include:  Orders Placed This Encounter  Procedures  . Comprehensive metabolic panel  . Lipid Profile  . EKG 12-Lead    I had a lengthy and detailed discussion with the patient regarding diagnoses, prognosis, diagnostic options, treatment options    I counseled the patient on importance of lifestyle modification including heart healthy diet, regular physical activity .   Disposition:   FU withCardiology in 3 months   I spent at least 25 minutes with the patient today and more than 50% of the time was spent counseling the patient and coordinating care.       Signed, Wende Bushy, MD  06/22/2016 10:28 AM    [facility]

## 2016-07-22 ENCOUNTER — Other Ambulatory Visit: Payer: Self-pay | Admitting: Physician Assistant

## 2016-08-24 ENCOUNTER — Other Ambulatory Visit: Payer: Self-pay | Admitting: Physician Assistant

## 2016-09-28 ENCOUNTER — Other Ambulatory Visit: Payer: Self-pay | Admitting: Internal Medicine

## 2016-11-04 ENCOUNTER — Other Ambulatory Visit: Payer: Self-pay | Admitting: Physician Assistant

## 2016-11-11 ENCOUNTER — Other Ambulatory Visit: Payer: Self-pay | Admitting: *Deleted

## 2016-11-11 MED ORDER — ASPIRIN 81 MG PO CHEW
CHEWABLE_TABLET | ORAL | 0 refills | Status: DC
Start: 2016-11-11 — End: 2016-12-25

## 2016-11-11 NOTE — Telephone Encounter (Signed)
Pt requesting Rx on Aspirin 81 mg tablet. Pt overdue for appointment.  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

## 2016-11-13 ENCOUNTER — Other Ambulatory Visit: Payer: Self-pay | Admitting: Physician Assistant

## 2016-11-14 NOTE — Telephone Encounter (Signed)
Refill Request. 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 Please advise if ok to refill.

## 2016-12-20 ENCOUNTER — Other Ambulatory Visit: Payer: Self-pay | Admitting: Cardiovascular Disease

## 2016-12-25 ENCOUNTER — Other Ambulatory Visit: Payer: Self-pay | Admitting: Cardiovascular Disease

## 2016-12-26 ENCOUNTER — Telehealth: Payer: Self-pay | Admitting: Internal Medicine

## 2016-12-26 DIAGNOSIS — Z79899 Other long term (current) drug therapy: Secondary | ICD-10-CM

## 2016-12-26 DIAGNOSIS — Z13220 Encounter for screening for lipoid disorders: Secondary | ICD-10-CM

## 2016-12-26 NOTE — Telephone Encounter (Signed)
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 Yvone Neu had ordered patient to have a Lipid panel and CMP prior to next appointment. Patient to see Dr End on 01/17/17. Routing to Dr. Saunders Revel to see if he wants patient to have lab work.

## 2016-12-26 NOTE — Telephone Encounter (Signed)
Pt is scheduled for 11/27 to see Dr. Saunders Revel. He asks if he can get blood work done before his appt. Please call and advise.

## 2016-12-26 NOTE — Telephone Encounter (Signed)
It is fine for him to come in at his convenience for fasting lipid panel and CMP anytime between now and his follow-up appointment with me on 11/27. That way we will be able to review the results and discuss any needed medication changes at his visit. Thanks.  Nelva Bush, MD [facility] Pager: [phone]

## 2016-12-27 ENCOUNTER — Other Ambulatory Visit: Payer: Self-pay | Admitting: Family Medicine

## 2016-12-27 DIAGNOSIS — M1A00X0 Idiopathic chronic gout, unspecified site, without tophus (tophi): Secondary | ICD-10-CM

## 2016-12-30 DIAGNOSIS — M10072 Idiopathic gout, left ankle and foot: Secondary | ICD-10-CM | POA: Diagnosis not present

## 2017-01-03 DIAGNOSIS — M10072 Idiopathic gout, left ankle and foot: Secondary | ICD-10-CM | POA: Diagnosis not present

## 2017-01-03 DIAGNOSIS — Z23 Encounter for immunization: Secondary | ICD-10-CM | POA: Diagnosis not present

## 2017-01-11 ENCOUNTER — Other Ambulatory Visit
Admission: RE | Admit: 2017-01-11 | Discharge: 2017-01-11 | Disposition: A | Discharge status: Home / Self Care | Payer: BLUE CROSS/BLUE SHIELD | Source: Ambulatory Visit | Attending: Internal Medicine | Admitting: Internal Medicine

## 2017-01-11 ENCOUNTER — Ambulatory Visit: Payer: BLUE CROSS/BLUE SHIELD | Admitting: Family Medicine

## 2017-01-11 ENCOUNTER — Encounter: Payer: Self-pay | Admitting: Family Medicine

## 2017-01-11 VITALS — BP 138/80 | HR 80 | Ht 74.0 in | Wt 244.0 lb

## 2017-01-11 DIAGNOSIS — L03032 Cellulitis of left toe: Secondary | ICD-10-CM | POA: Diagnosis not present

## 2017-01-11 DIAGNOSIS — K219 Gastro-esophageal reflux disease without esophagitis: Secondary | ICD-10-CM

## 2017-01-11 DIAGNOSIS — Z13220 Encounter for screening for lipoid disorders: Secondary | ICD-10-CM | POA: Diagnosis not present

## 2017-01-11 DIAGNOSIS — Z79899 Other long term (current) drug therapy: Secondary | ICD-10-CM | POA: Insufficient documentation

## 2017-01-11 LAB — COMPREHENSIVE METABOLIC PANEL
ALBUMIN: 4.1 g/dL (ref 3.5–5.0)
ALK PHOS: 78 U/L (ref 38–126)
ALT: 20 U/L (ref 17–63)
ANION GAP: 6 (ref 5–15)
AST: 20 U/L (ref 15–41)
BUN: 14 mg/dL (ref 6–20)
CALCIUM: 9.4 mg/dL (ref 8.9–10.3)
CO2: 25 mmol/L (ref 22–32)
Chloride: 108 mmol/L (ref 101–111)
Creatinine, Ser: 0.9 mg/dL (ref 0.61–1.24)
GFR calc Af Amer: >60 mL/min (ref >60)
GFR calc non Af Amer: >60 mL/min (ref >60)
Glucose, Bld: 108 mg/dL — ABNORMAL HIGH (ref 65–99)
POTASSIUM: 4.7 mmol/L (ref 3.5–5.1)
SODIUM: 139 mmol/L (ref 135–145)
TOTAL PROTEIN: 7.5 g/dL (ref 6.5–8.1)
Total Bilirubin: 1.2 mg/dL (ref 0.3–1.2)

## 2017-01-11 LAB — LIPID PANEL
CHOL/HDL RATIO: 2.8 ratio
CHOLESTEROL: 128 mg/dL (ref 0–200)
HDL: 45 mg/dL (ref >40)
LDL Cholesterol: 67 mg/dL (ref 0–99)
Triglycerides: 78 mg/dL (ref <150)
VLDL: 16 mg/dL (ref 0–40)

## 2017-01-11 MED ORDER — INDOMETHACIN 50 MG PO CAPS
50.0000 mg | ORAL_CAPSULE | Freq: Three times a day (TID) | ORAL | 0 refills | Status: DC | PRN
Start: 2017-01-11 — End: 2017-02-06

## 2017-01-11 MED ORDER — SULFAMETHOXAZOLE-TRIMETHOPRIM 800-160 MG PO TABS: 1.0000 | ORAL_TABLET | Freq: Two times a day (BID) | ORAL | 0 refills | Status: DC

## 2017-01-11 MED ORDER — OMEPRAZOLE 40 MG PO CPDR: 40.0000 mg | DELAYED_RELEASE_CAPSULE | Freq: Every day | ORAL | 3 refills | Status: DC

## 2017-01-11 NOTE — Progress Notes (Signed)
Name: Rodney Briggs   MRN: [id number]    DOB: January 08, 1966   Date:01/11/2017       Progress Note  Subjective  Chief Complaint  Chief Complaint  Patient presents with  . Gout    was seen in [place] for gout flare up- they did a cbc and uric acid level which we are trying to get. Was given RX for cephalexin 500mg  tid and pred 10mg  x 7 days. Has one day left of cephalexin- pain is in L) foot on top beneath the toes  . Gastroesophageal Reflux    needs refill on omeprazole    Followup for cellulitis and possible gout.   Gastroesophageal Reflux  He reports no abdominal pain, no belching, no chest pain, no choking, no coughing, no dysphagia, no early satiety, no globus sensation, no heartburn, no hoarse voice, no nausea, no sore throat, no stridor, no tooth decay, no water brash or no wheezing. This is a recurrent problem. The current episode started more than 1 year ago. The problem has been waxing and waning. The symptoms are aggravated by certain foods. Pertinent negatives include no melena or weight loss. Risk factors include Barrett's esophagus. He has tried a PPI for the symptoms. The treatment provided moderate relief. Past procedures do not include an abdominal ultrasound, an EGD, esophageal pH monitoring or H. pylori antibody titer.    No problem-specific Assessment & Plan notes found for this encounter.   Past Medical History:  Diagnosis Date  . CAD S/P percutaneous coronary angioplasty 08/19/2015   a. treadmill myoview 08/14/15: medium defect of moderate severity in the basal inferior, basal, inferolat, mid inf, and mid inferolat wall. Findings c/w prior MI in LCx dist w/ mild per-infarct ischemia. EF 43%, intermediate risk; b. diagnostic cardiac cath 08/19/15: occluded OM3 s/p PCI/DES with Xience DES 2.25 mm x 18 mm, ramus 40%, inferolateral wall HK c/w occluded OM branch, nl EF, nl LVEDP  . GERD (gastroesophageal reflux disease)   . Gout   . Mild mitral  regurgitation    a. echo 08/14/15: EF 60-65%, no RWMA, LV dia fxn nl, mild MR, LA mildly dilated, RV sys fxn nl, PASP nl    Past Surgical History:  Procedure Laterality Date  . CARDIAC CATHETERIZATION Left 08/19/2015   Procedure: Left Heart Cath and Coronary Angiography;  Surgeon: Leonie Man, MD;  Location: [facility];  Service: Cardiovascular;  Laterality: Left;  . CARDIAC CATHETERIZATION N/A 08/19/2015   Procedure: Coronary Stent Intervention;  Surgeon: Leonie Man, MD;  Location: [facility];  Service: Cardiovascular;  Laterality: N/A;    Family History  Problem Relation Age of Onset  . Heart disease Mother   . Stroke Mother   . Heart disease Father     Social History   Socioeconomic History  . Marital status: Divorced    Spouse name: Not on file  . Number of children: Not on file  . Years of education: Not on file  . Highest education level: Not on file  Social Needs  . Financial resource strain: Not on file  . Food insecurity - worry: Not on file  . Food insecurity - inability: Not on file  . Transportation needs - medical: Not on file  . Transportation needs - non-medical: Not on file  Occupational History  . Not on file  Tobacco Use  . Smoking status: Never Smoker  . Smokeless tobacco: Never Used  Substance and Sexual Activity  . Alcohol  use: Yes    Alcohol/week: 0.0 oz  . Drug use: No  . Sexual activity: Yes  Other Topics Concern  . Not on file  Social History Narrative  . Not on file    No Known Allergies  Outpatient Medications Prior to Visit  Medication Sig Dispense Refill  . atorvastatin (LIPITOR) 80 MG tablet TAKE 1 TABLET BY MOUTH DAILY AT 6PM 90 tablet 0  . carvedilol (COREG) 3.125 MG tablet Take 1 tablet (3.125 mg total) by mouth 2 (two) times daily with a meal. 180 tablet 3  . [facility] ASPIRIN ADULT LOW DOSE 81 MG chewable tablet CHEW 1 TABLET BY MOUTH EVERY DAY 36 tablet 0  . nitroGLYCERIN (NITROSTAT) 0.4 MG SL tablet Place  1 tablet (0.4 mg total) under the tongue every 5 (five) minutes as needed for chest pain. 90 tablet 3  . omeprazole (PRILOSEC) 40 MG capsule Take 1 capsule (40 mg total) by mouth daily. 30 capsule 11  . indomethacin (INDOCIN) 50 MG capsule Take 50 mg by mouth 3 (three) times daily as needed for mild pain or moderate pain.     No facility-administered medications prior to visit.     Review of Systems  Constitutional: Negative for chills, fever, malaise/fatigue and weight loss.  HENT: Negative for ear discharge, ear pain, hoarse voice and sore throat.   Eyes: Negative for blurred vision.  Respiratory: Negative for cough, sputum production, choking, shortness of breath and wheezing.   Cardiovascular: Negative for chest pain, palpitations and leg swelling.  Gastrointestinal: Negative for abdominal pain, blood in stool, constipation, diarrhea, dysphagia, heartburn, melena and nausea.  Genitourinary: Negative for dysuria, frequency, hematuria and urgency.  Musculoskeletal: Negative for back pain, joint pain, myalgias and neck pain.  Skin: Negative for rash.  Neurological: Negative for dizziness, tingling, sensory change, focal weakness and headaches.  Endo/Heme/Allergies: Negative for environmental allergies and polydipsia. Does not bruise/bleed easily.  Psychiatric/Behavioral: Negative for depression and suicidal ideas. The patient is not nervous/anxious and does not have insomnia.      Objective  Vitals:   01/11/17 1113  BP: 138/80  Pulse: 80  Weight: 244 lb (110.7 kg)  Height: 6\' 2"  (1.88 m)    Physical Exam  Constitutional: He is oriented to person, place, and time and well-developed, well-nourished, and in no distress.  HENT:  Head: Normocephalic.  Right Ear: External ear normal.  Left Ear: External ear normal.  Nose: Nose normal.  Mouth/Throat: Oropharynx is clear and moist.  Eyes: Conjunctivae and EOM are normal. Pupils are equal, round, and reactive to light. Right eye  exhibits no discharge. Left eye exhibits no discharge. No scleral icterus.  Neck: Normal range of motion. Neck supple. No JVD present. No tracheal deviation present. No thyromegaly present.  Cardiovascular: Normal rate, regular rhythm, normal heart sounds and intact distal pulses. Exam reveals no gallop and no friction rub.  No murmur heard. Pulmonary/Chest: Breath sounds normal. No respiratory distress. He has no wheezes. He has no rales.  Abdominal: Soft. Bowel sounds are normal. He exhibits no mass. There is no hepatosplenomegaly. There is no tenderness. There is no rebound, no guarding and no CVA tenderness.  Musculoskeletal: Normal range of motion. He exhibits no edema.       Left foot: There is tenderness and swelling. There is no deformity.       Feet:  Lymphadenopathy:    He has no cervical adenopathy.  Neurological: He is alert and oriented to person, place, and time. He has normal  sensation, normal strength, normal reflexes and intact cranial nerves.  Skin: Skin is warm. No rash noted.  Psychiatric: Mood and affect normal.  Nursing note and vitals reviewed.     Assessment & Plan  Problem List Items Addressed This Visit    None    Visit Diagnoses    Cellulitis of toe of left foot    -  Primary   Relevant Medications   sulfamethoxazole-trimethoprim (BACTRIM DS,SEPTRA DS) 800-160 MG tablet   Gastroesophageal reflux disease, esophagitis presence not specified       Relevant Medications   omeprazole (PRILOSEC) 40 MG capsule      Meds ordered this encounter  Medications  . omeprazole (PRILOSEC) 40 MG capsule    Sig: Take 1 capsule (40 mg total) by mouth daily.    Dispense:  90 capsule    Refill:  3  . sulfamethoxazole-trimethoprim (BACTRIM DS,SEPTRA DS) 800-160 MG tablet    Sig: Take 1 tablet by mouth 2 (two) times daily.    Dispense:  20 tablet    Refill:  0  . indomethacin (INDOCIN) 50 MG capsule    Sig: Take 1 capsule (50 mg total) by mouth 3 (three) times daily  as needed for mild pain or moderate pain.    Dispense:  90 capsule    Refill:  0      Dr. Otilio Miu [facility]  01/11/17

## 2017-01-17 ENCOUNTER — Ambulatory Visit: Payer: BLUE CROSS/BLUE SHIELD | Admitting: Internal Medicine

## 2017-01-17 VITALS — BP 122/74 | HR 70 | Ht 74.0 in | Wt 243.5 lb

## 2017-01-17 DIAGNOSIS — Z9861 Coronary angioplasty status: Secondary | ICD-10-CM | POA: Diagnosis not present

## 2017-01-17 DIAGNOSIS — M79672 Pain in left foot: Secondary | ICD-10-CM

## 2017-01-17 DIAGNOSIS — E785 Hyperlipidemia, unspecified: Secondary | ICD-10-CM | POA: Diagnosis not present

## 2017-01-17 DIAGNOSIS — I2510 Atherosclerotic heart disease of native coronary artery without angina pectoris: Secondary | ICD-10-CM

## 2017-01-17 NOTE — Progress Notes (Signed)
Follow-up Outpatient Visit Date: 01/17/2017  Primary Care Provider: Juline Patch, MD [address] [place] [address]  Chief Complaint: Follow-up coronary artery disease  HPI:  Rodney Briggs is a 51 y.o. year-old male with history of coronary artery disease status post PCI to OM3 (07/2015), mild mitral regurgitation, and GERD, who presents for follow-up of coronary artery disease.  He was previously followed in our office by Dr. Yvone Neu, having last been seen in May.  Today, Rodney Briggs reports that he has been doing well with the exception of pain involving the left foot.  He was initially treated for possible gout flare, though he only had transient improvement with steroids.  He is now on Bactrim and has noted decreased pain and swelling.  He has 2 more days of antibiotics.  From a heart standpoint, Rodney Briggs has been doing well.  He denies chest pain, shortness of breath, palpitations, lightheadedness, and edema.  He does not exercise regularly but is fairly active at his job.  He owns a [employer].  He is compliant with his medications and has not had any side effects.   --------------------------------------------------------------------------------------------------  Past Medical History:  Diagnosis Date  . CAD S/P percutaneous coronary angioplasty 08/19/2015   a. treadmill myoview 08/14/15: medium defect of moderate severity in the basal inferior, basal, inferolat, mid inf, and mid inferolat wall. Findings c/w prior MI in LCx dist w/ mild per-infarct ischemia. EF 43%, intermediate risk; b. diagnostic cardiac cath 08/19/15: occluded OM3 s/p PCI/DES with Xience DES 2.25 mm x 18 mm, ramus 40%, inferolateral wall HK c/w occluded OM branch, nl EF, nl LVEDP  . GERD (gastroesophageal reflux disease)   . Gout   . Mild mitral regurgitation    a. echo 08/14/15: EF 60-65%, no RWMA, LV dia fxn nl, mild MR, LA mildly dilated, RV sys fxn nl, PASP nl   Past Surgical History:    Procedure Laterality Date  . CARDIAC CATHETERIZATION Left 08/19/2015   Procedure: Left Heart Cath and Coronary Angiography;  Surgeon: Leonie Man, MD;  Location: [facility];  Service: Cardiovascular;  Laterality: Left;  . CARDIAC CATHETERIZATION N/A 08/19/2015   Procedure: Coronary Stent Intervention;  Surgeon: Leonie Man, MD;  Location: [facility];  Service: Cardiovascular;  Laterality: N/A;    Current Meds  Medication Sig  . atorvastatin (LIPITOR) 80 MG tablet TAKE 1 TABLET BY MOUTH DAILY AT 6PM  . carvedilol (COREG) 3.125 MG tablet Take 1 tablet (3.125 mg total) by mouth 2 (two) times daily with a meal.  . [facility]S ASPIRIN ADULT LOW DOSE 81 MG chewable tablet CHEW 1 TABLET BY MOUTH EVERY DAY  . indomethacin (INDOCIN) 50 MG capsule Take 1 capsule (50 mg total) by mouth 3 (three) times daily as needed for mild pain or moderate pain.  . nitroGLYCERIN (NITROSTAT) 0.4 MG SL tablet Place 1 tablet (0.4 mg total) under the tongue every 5 (five) minutes as needed for chest pain.  Marland Kitchen omeprazole (PRILOSEC) 40 MG capsule Take 1 capsule (40 mg total) by mouth daily.  Marland Kitchen sulfamethoxazole-trimethoprim (BACTRIM DS,SEPTRA DS) 800-160 MG tablet Take 1 tablet by mouth 2 (two) times daily.    Allergies: Patient has no known allergies.  Social History   Socioeconomic History  . Marital status: Divorced    Spouse name: Not on file  . Number of children: Not on file  . Years of education: Not on file  . Highest education level: Not on file  Social Needs  . Financial resource strain: Not on file  . Food insecurity - worry: Not on file  . Food insecurity - inability: Not on file  . Transportation needs - medical: Not on file  . Transportation needs - non-medical: Not on file  Occupational History  . Not on file  Tobacco Use  . Smoking status: Never Smoker  . Smokeless tobacco: Never Used  Substance and Sexual Activity  . Alcohol use: Yes    Alcohol/week: 0.0 oz  . Drug use:  No  . Sexual activity: Yes  Other Topics Concern  . Not on file  Social History Narrative  . Not on file    Family History  Problem Relation Age of Onset  . Heart disease Mother   . Stroke Mother   . Heart disease Father     Review of Systems: A 12-system review of systems was performed and was negative except as noted in the HPI.  --------------------------------------------------------------------------------------------------  Physical Exam: BP 122/74 (BP Location: Left Arm, Patient Position: Sitting, Cuff Size: Normal)   Pulse 70   Ht 6\' 2"  (1.88 m)   Wt 243 lb 8 oz (110.5 kg)   BMI 31.26 kg/m   General: Obese man, seated comfortably in the exam room. HEENT: No conjunctival pallor or scleral icterus. Moist mucous membranes.  OP clear. Neck: Supple without lymphadenopathy, thyromegaly, JVD, or HJR. No carotid bruit. Lungs: Normal work of breathing. Clear to auscultation bilaterally without wheezes or crackles. Heart: Regular rate and rhythm without murmurs, rubs, or gallops. Non-displaced PMI. Abd: Bowel sounds present. Soft, NT/ND without hepatosplenomegaly Ext: No lower extremity edema. Radial, PT, and DP pulses are 2+ bilaterally. Skin: Warm and dry.  Mild erythema and swelling on the dorsum of the left foot.  No discharge or fluctuance.  EKG: Normal sinus rhythm without abnormalities.  Lab Results  Component Value Date   WBC 6.9 08/20/2015   HGB 14.9 08/20/2015   HCT 43.4 08/20/2015   MCV 85.8 08/20/2015   PLT 204 08/20/2015    Lab Results  Component Value Date   NA 139 01/11/2017   K 4.7 01/11/2017   CL 108 01/11/2017   CO2 25 01/11/2017   BUN 14 01/11/2017   CREATININE 0.90 01/11/2017   GLUCOSE 108 (H) 01/11/2017   ALT 20 01/11/2017    Lab Results  Component Value Date   CHOL 128 01/11/2017   HDL 45 01/11/2017   LDLCALC 67 01/11/2017   TRIG 78 01/11/2017   CHOLHDL 2.8 01/11/2017     --------------------------------------------------------------------------------------------------  ASSESSMENT AND PLAN: Coronary artery disease without angina No symptoms to suggest worsening coronary insufficiency.  Rodney Briggs is tolerating his current medications well.  We will continue with indefinite aspirin, carvedilol, and atorvastatin.  I have encouraged heart healthy diet and exercise.  Hyperlipidemia Lipid panel last week demonstrates LDL of 67, which is at goal (less than 70).  We will continue with indefinite atorvastatin 80 mg daily.  I have encouraged Rodney Briggs to be active and to lose weight.  Left foot pain Minimal swelling and erythema noted today.  Symptoms seem to be improving with antibiotics.  I will defer continued management to Dr. Ronnald Ramp.  Follow-up: Return to clinic in 1 year.  Nelva Bush, MD 01/17/2017 2:33 PM

## 2017-01-17 NOTE — Patient Instructions (Signed)
Medication Instructions:  Your physician recommends that you continue on your current medications as directed. Please refer to the Current Medication list given to you today.   Labwork: none  Testing/Procedures: none  Follow-Up: Your physician wants you to follow-up in: 12 MONTHS WITH DR END.  You will receive a reminder letter in the mail two months in advance. If you don't receive a letter, please call our office to schedule the follow-up appointment.  If you need a refill on your cardiac medications before your next appointment, please call your pharmacy.   

## 2017-01-18 ENCOUNTER — Encounter: Payer: Self-pay | Admitting: Internal Medicine

## 2017-01-18 DIAGNOSIS — E785 Hyperlipidemia, unspecified: Secondary | ICD-10-CM | POA: Insufficient documentation

## 2017-01-27 ENCOUNTER — Other Ambulatory Visit: Payer: Self-pay | Admitting: Cardiovascular Disease

## 2017-01-27 NOTE — Telephone Encounter (Signed)
Please advise if ok to refill Aspirin.

## 2017-02-06 ENCOUNTER — Other Ambulatory Visit: Payer: Self-pay | Admitting: Family Medicine

## 2017-02-11 ENCOUNTER — Other Ambulatory Visit: Payer: Self-pay | Admitting: Cardiovascular Disease

## 2017-06-16 ENCOUNTER — Other Ambulatory Visit: Payer: Self-pay

## 2017-06-16 MED ORDER — CARVEDILOL 3.125 MG PO TABS: 3.1250 mg | ORAL_TABLET | Freq: Two times a day (BID) | ORAL | 3 refills | Status: DC

## 2017-06-28 ENCOUNTER — Other Ambulatory Visit: Payer: Self-pay | Admitting: Family Medicine

## 2017-07-24 IMAGING — CR DG CHEST 2V
1 series · 2 of 2 positions shown · non-contrast
Comparison: None.

CLINICAL DATA: Four day history of left-sided chest pain radiating
into the left arm and left side of the neck.

EXAM:
CHEST  2 VIEW

[Series 1: dg chest 2 view · 0.14mm/px · 2 of 2 slices shown]
[im 1/2]
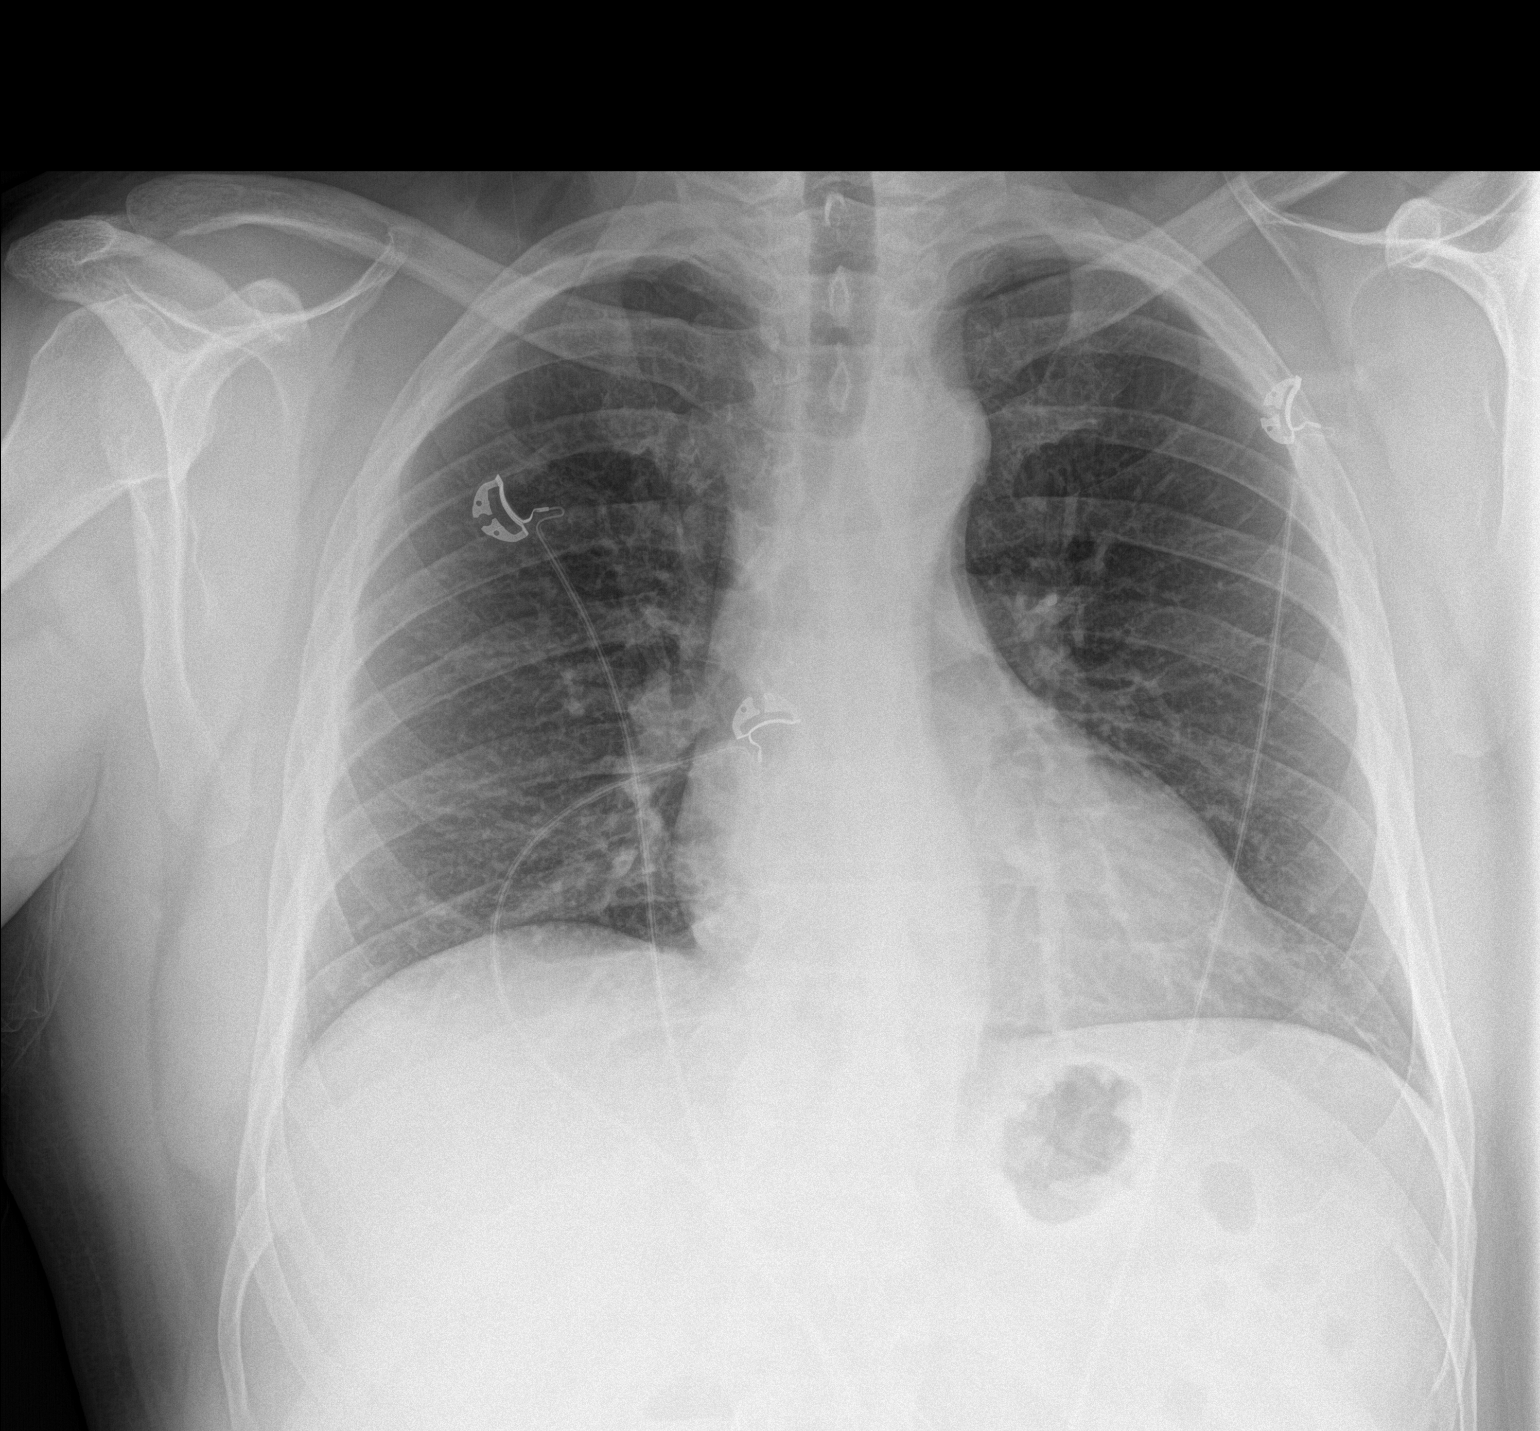
[im 2/2]
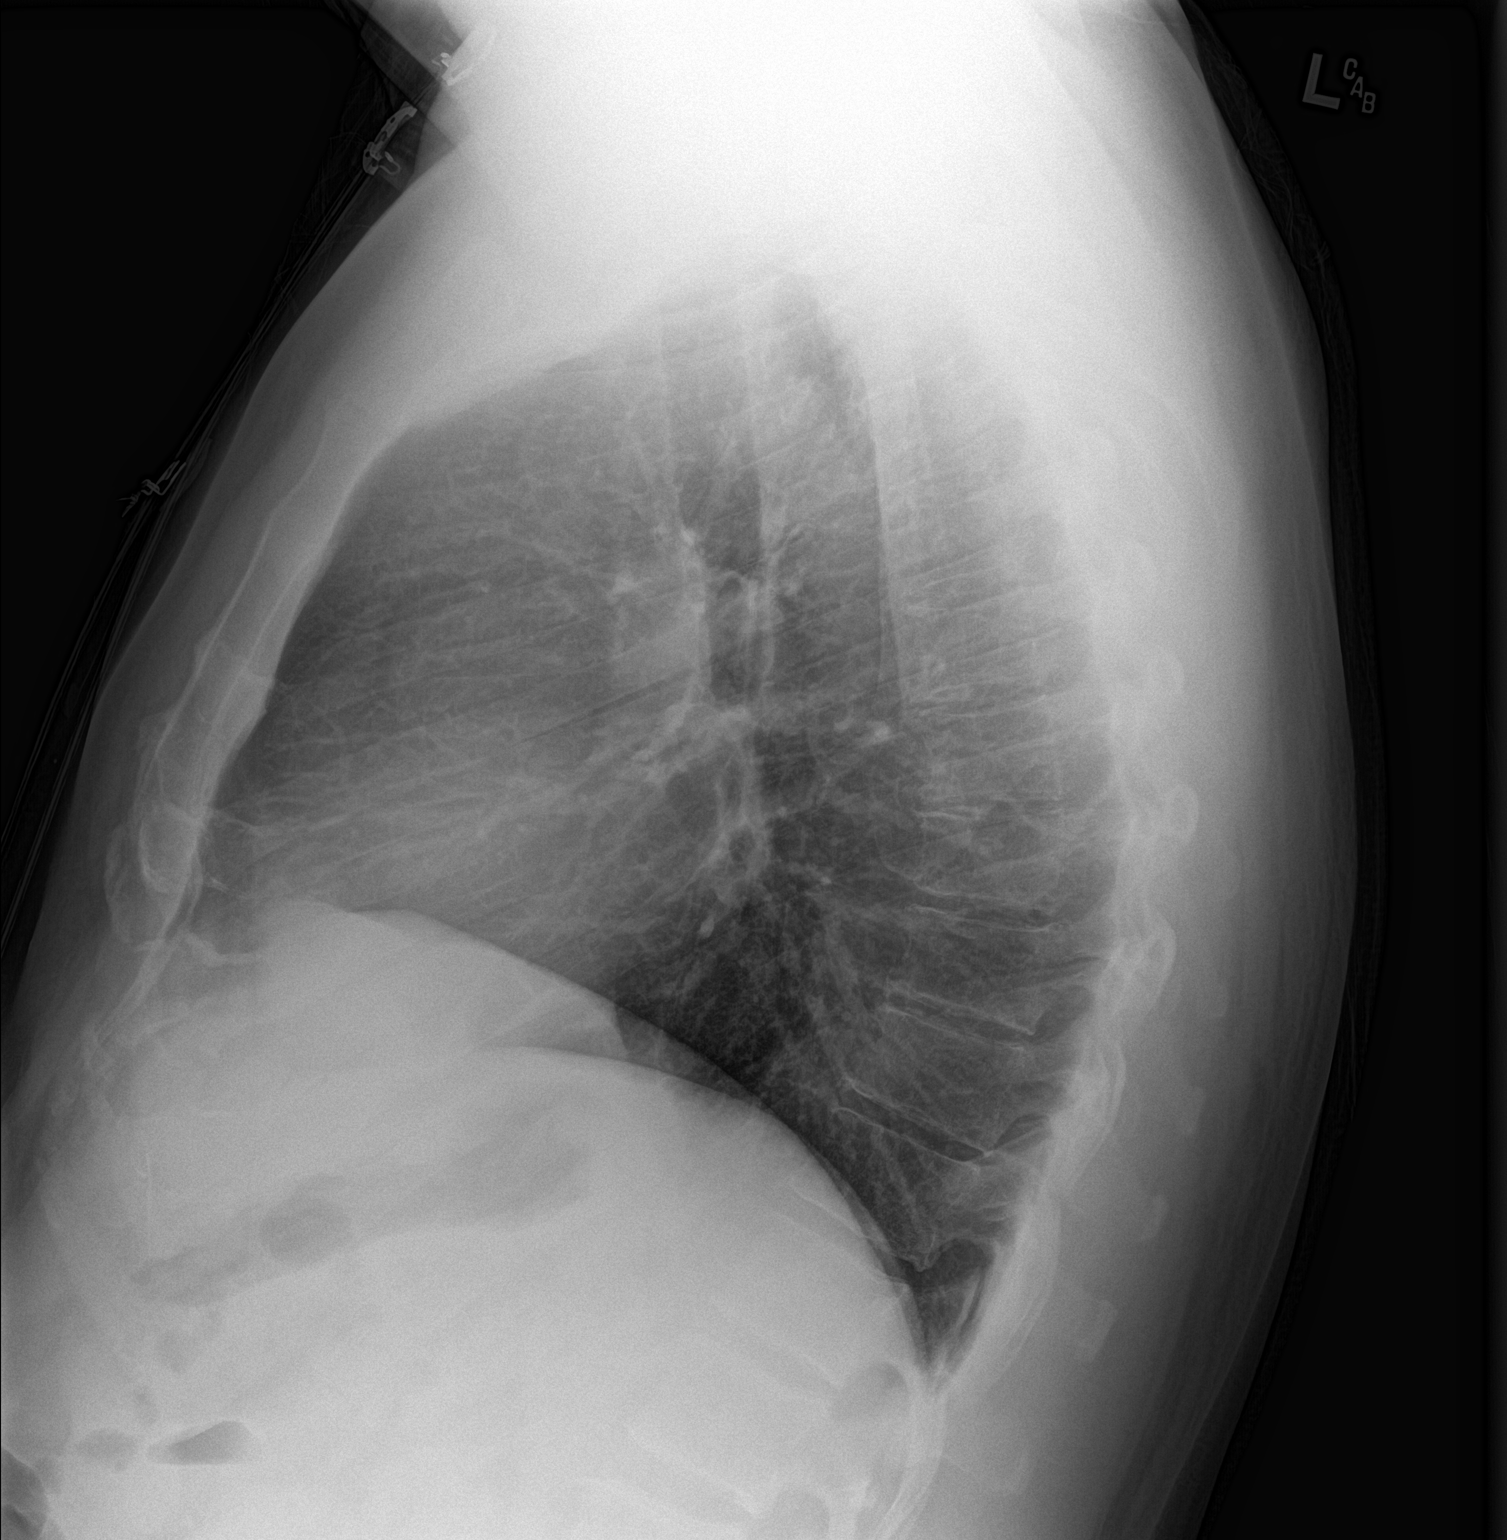

[2 of 2 positions shown; findings below may reference images not displayed]

FINDINGS: Cardiac silhouette normal in size. Hilar and mediastinal contours
unremarkable. Lungs clear. Bronchovascular markings normal.
Pulmonary vascularity normal. No visible pleural effusions. No
pneumothorax. Minimal to mild degenerative changes involving the
thoracic spine.
IMPRESSION: No acute cardiopulmonary disease.

## 2017-10-19 ENCOUNTER — Ambulatory Visit: Payer: BLUE CROSS/BLUE SHIELD | Admitting: Family Medicine

## 2017-10-19 ENCOUNTER — Encounter: Payer: Self-pay | Admitting: Family Medicine

## 2017-10-19 VITALS — BP 120/70 | HR 68 | Ht 74.0 in | Wt 243.0 lb

## 2017-10-19 DIAGNOSIS — Z23 Encounter for immunization: Secondary | ICD-10-CM | POA: Diagnosis not present

## 2017-10-19 DIAGNOSIS — T753XXA Motion sickness, initial encounter: Secondary | ICD-10-CM | POA: Diagnosis not present

## 2017-10-19 DIAGNOSIS — K219 Gastro-esophageal reflux disease without esophagitis: Secondary | ICD-10-CM | POA: Diagnosis not present

## 2017-10-19 DIAGNOSIS — M10032 Idiopathic gout, left wrist: Secondary | ICD-10-CM

## 2017-10-19 MED ORDER — SCOPOLAMINE 1 MG/3DAYS TD PT72: 1.0000 | MEDICATED_PATCH | TRANSDERMAL | 0 refills | Status: DC

## 2017-10-19 MED ORDER — OMEPRAZOLE 40 MG PO CPDR: 40.0000 mg | DELAYED_RELEASE_CAPSULE | Freq: Every day | ORAL | 3 refills | Status: DC

## 2017-10-19 MED ORDER — INDOMETHACIN 25 MG PO CAPS: ORAL_CAPSULE | ORAL | 1 refills | Status: DC

## 2017-10-19 NOTE — Progress Notes (Signed)
Name: Rodney Briggs   MRN: [id number]    DOB: May 01, 1965   Date:10/19/2017       Progress Note  Subjective  Chief Complaint  Chief Complaint  Patient presents with  . Hypertension  . Hyperlipidemia  . Gastroesophageal Reflux  . Wrist Pain    R) wrist hurts worse than left  . motion sickness    wants scopolamine patches for deep sea fishing    Hypertension  This is a chronic problem. The current episode started more than 1 year ago. The problem is unchanged. The problem is controlled. Pertinent negatives include no anxiety, blurred vision, chest pain, headaches, malaise/fatigue, neck pain, orthopnea, palpitations, peripheral edema, PND, shortness of breath or sweats. There are no associated agents to hypertension. There are no known risk factors for coronary artery disease. Past treatments include beta blockers. The current treatment provides moderate improvement. There are no compliance problems.  Hypertensive end-organ damage includes CAD/MI. There is no history of angina, kidney disease, CVA, heart failure, left ventricular hypertrophy, PVD or retinopathy. There is no history of chronic renal disease, a hypertension causing med or renovascular disease.  Hyperlipidemia  This is a chronic problem. The current episode started more than 1 year ago. The problem is controlled. Recent lipid tests were reviewed and are normal. He has no history of chronic renal disease, diabetes, hypothyroidism, liver disease, obesity or nephrotic syndrome. Pertinent negatives include no chest pain, focal sensory loss, focal weakness, leg pain, myalgias or shortness of breath. Current antihyperlipidemic treatment includes statins. The current treatment provides moderate improvement of lipids. There are no compliance problems.  Risk factors for coronary artery disease include dyslipidemia, hypertension and male sex.  Gastroesophageal Reflux  He reports no abdominal pain, no belching, no chest pain, no coughing, no  dysphagia, no early satiety, no heartburn, no hoarse voice, no nausea, no sore throat or no wheezing. This is a chronic problem. The problem has been unchanged. The symptoms are aggravated by certain foods. Pertinent negatives include no anemia, fatigue, melena, muscle weakness or weight loss. He has tried a PPI for the symptoms. The treatment provided moderate relief. Past procedures do not include an abdominal ultrasound, an EGD, esophageal manometry, esophageal pH monitoring, H. pylori antibody titer or a UGI. Past invasive treatments do not include gastroplication.  Wrist Pain   The pain is present in the right wrist and left wrist. This is a new problem. The current episode started in the past 7 days. The quality of the pain is described as aching. Pertinent negatives include no fever or tingling. There is no history of diabetes.    No problem-specific Assessment & Plan notes found for this encounter.   Past Medical History:  Diagnosis Date  . CAD S/P percutaneous coronary angioplasty 08/19/2015   a. treadmill myoview 08/14/15: medium defect of moderate severity in the basal inferior, basal, inferolat, mid inf, and mid inferolat wall. Findings c/w prior MI in LCx dist w/ mild per-infarct ischemia. EF 43%, intermediate risk; b. diagnostic cardiac cath 08/19/15: occluded OM3 s/p PCI/DES with Xience DES 2.25 mm x 18 mm, ramus 40%, inferolateral wall HK c/w occluded OM branch, nl EF, nl LVEDP  . GERD (gastroesophageal reflux disease)   . Gout   . Mild mitral regurgitation    a. echo 08/14/15: EF 60-65%, no RWMA, LV dia fxn nl, mild MR, LA mildly dilated, RV sys fxn nl, PASP nl    Past Surgical History:  Procedure Laterality Date  . CARDIAC CATHETERIZATION Left 08/19/2015  Procedure: Left Heart Cath and Coronary Angiography;  Surgeon: Leonie Man, MD;  Location: [facility];  Service: Cardiovascular;  Laterality: Left;  . CARDIAC CATHETERIZATION N/A 08/19/2015   Procedure: Coronary  Stent Intervention;  Surgeon: Leonie Man, MD;  Location: [facility];  Service: Cardiovascular;  Laterality: N/A;    Family History  Problem Relation Age of Onset  . Heart disease Mother   . Stroke Mother   . Heart disease Father     Social History   Socioeconomic History  . Marital status: Divorced    Spouse name: Not on file  . Number of children: Not on file  . Years of education: Not on file  . Highest education level: Not on file  Occupational History  . Not on file  Social Needs  . Financial resource strain: Not on file  . Food insecurity:    Worry: Not on file    Inability: Not on file  . Transportation needs:    Medical: Not on file    Non-medical: Not on file  Tobacco Use  . Smoking status: Never Smoker  . Smokeless tobacco: Never Used  Substance and Sexual Activity  . Alcohol use: Yes    Alcohol/week: 0.0 standard drinks  . Drug use: No  . Sexual activity: Yes  Lifestyle  . Physical activity:    Days per week: Not on file    Minutes per session: Not on file  . Stress: Not on file  Relationships  . Social connections:    Talks on phone: Not on file    Gets together: Not on file    Attends religious service: Not on file    Active member of club or organization: Not on file    Attends meetings of clubs or organizations: Not on file    Relationship status: Not on file  . Intimate partner violence:    Fear of current or ex partner: Not on file    Emotionally abused: Not on file    Physically abused: Not on file    Forced sexual activity: Not on file  Other Topics Concern  . Not on file  Social History Narrative  . Not on file    No Known Allergies  Outpatient Medications Prior to Visit  Medication Sig Dispense Refill  . aspirin 81 MG chewable tablet CHEW 1 TABLET BY MOUTH EVERY DAY 90 tablet 3  . atorvastatin (LIPITOR) 80 MG tablet TAKE 1 TABLET BY MOUTH DAILY AT 6PM (NEED APPT FOR FURTHER REFILLS) 90 tablet 3  . carvedilol (COREG)  3.125 MG tablet Take 1 tablet (3.125 mg total) by mouth 2 (two) times daily with a meal. 180 tablet 3  . nitroGLYCERIN (NITROSTAT) 0.4 MG SL tablet Place 1 tablet (0.4 mg total) under the tongue every 5 (five) minutes as needed for chest pain. 90 tablet 3  . indomethacin (INDOCIN) 25 MG capsule TAKE 2 CAPSULES (50 MG TOTAL) BY MOUTH 3 TIMES DAILY AS NEEDED FOR MILD PAIN OR MODERATE PAIN. 60 capsule 0  . omeprazole (PRILOSEC) 40 MG capsule Take 1 capsule (40 mg total) by mouth daily. 90 capsule 3  . indomethacin (INDOCIN) 25 MG capsule TAKE 2 CAPSULES (50 MG TOTAL) BY MOUTH 3 TIMES DAILY AS NEEDED FOR MILD PAIN OR MODERATE PAIN. 60 capsule 0  . sulfamethoxazole-trimethoprim (BACTRIM DS,SEPTRA DS) 800-160 MG tablet Take 1 tablet by mouth 2 (two) times daily. 20 tablet 0   No facility-administered medications prior to visit.  Review of Systems  Constitutional: Negative for chills, fatigue, fever, malaise/fatigue and weight loss.  HENT: Negative for ear discharge, ear pain, hoarse voice and sore throat.   Eyes: Negative for blurred vision.  Respiratory: Negative for cough, sputum production, shortness of breath and wheezing.   Cardiovascular: Negative for chest pain, palpitations, orthopnea, leg swelling and PND.  Gastrointestinal: Negative for abdominal pain, blood in stool, constipation, diarrhea, dysphagia, heartburn, melena and nausea.  Genitourinary: Negative for dysuria, frequency, hematuria and urgency.  Musculoskeletal: Negative for back pain, joint pain, myalgias, muscle weakness and neck pain.  Skin: Negative for rash.  Neurological: Negative for dizziness, tingling, sensory change, focal weakness and headaches.  Endo/Heme/Allergies: Negative for environmental allergies and polydipsia. Does not bruise/bleed easily.  Psychiatric/Behavioral: Negative for depression and suicidal ideas. The patient is not nervous/anxious and does not have insomnia.      Objective  Vitals:   10/19/17  1401  BP: 120/70  Pulse: 68  Weight: 243 lb (110.2 kg)  Height: 6\' 2"  (1.88 m)    Physical Exam  Constitutional: He is oriented to person, place, and time.  HENT:  Head: Normocephalic.  Right Ear: External ear normal.  Left Ear: External ear normal.  Nose: Nose normal.  Mouth/Throat: Oropharynx is clear and moist.  Eyes: Pupils are equal, round, and reactive to light. Conjunctivae and EOM are normal. Right eye exhibits no discharge. Left eye exhibits no discharge. No scleral icterus.  Neck: Normal range of motion. Neck supple. No JVD present. No tracheal deviation present. No thyromegaly present.  Cardiovascular: Normal rate, regular rhythm, normal heart sounds and intact distal pulses. Exam reveals no gallop and no friction rub.  No murmur heard. Pulmonary/Chest: Breath sounds normal. No respiratory distress. He has no wheezes. He has no rales.  Abdominal: Soft. Bowel sounds are normal. He exhibits no mass. There is no hepatosplenomegaly. There is no tenderness. There is no rebound, no guarding and no CVA tenderness.  Musculoskeletal: Normal range of motion. He exhibits no edema.       Right wrist: He exhibits tenderness.       Left wrist: He exhibits tenderness.  Lymphadenopathy:    He has no cervical adenopathy.  Neurological: He is alert and oriented to person, place, and time. He has normal strength and normal reflexes. No cranial nerve deficit.  Skin: Skin is warm. No rash noted.      Assessment & Plan  Problem List Items Addressed This Visit    None    Visit Diagnoses    Acute idiopathic gout of left wrist    -  Primary   refill indomethacin as prescribed for acute attacks   Relevant Medications   indomethacin (INDOCIN) 25 MG capsule   Gastroesophageal reflux disease, esophagitis presence not specified       continue omeprazole as directed- stable on meds   Relevant Medications   omeprazole (PRILOSEC) 40 MG capsule   scopolamine (TRANSDERM-SCOP) 1 MG/3DAYS   Need  for diphtheria-tetanus-pertussis (Tdap) vaccine       TDAP administered   Relevant Orders   Tdap vaccine greater than or equal to 7yo IM (Completed)   Influenza vaccine needed       flu vacc administered   Relevant Orders   Flu Vaccine QUAD 6+ mos PF IM (Fluarix Quad PF) (Completed)   Motion sickness, initial encounter       Scopolamine patches prescribed   Relevant Medications   scopolamine (TRANSDERM-SCOP) 1 MG/3DAYS      Meds ordered  this encounter  Medications  . omeprazole (PRILOSEC) 40 MG capsule    Sig: Take 1 capsule (40 mg total) by mouth daily.    Dispense:  90 capsule    Refill:  3  . indomethacin (INDOCIN) 25 MG capsule    Sig: TAKE 2 CAPSULES (50 MG TOTAL) BY MOUTH 3 TIMES DAILY AS NEEDED FOR MILD PAIN OR MODERATE PAIN.    Dispense:  60 capsule    Refill:  1  . scopolamine (TRANSDERM-SCOP) 1 MG/3DAYS    Sig: Place 1 patch (1.5 mg total) onto the skin every 3 (three) days.    Dispense:  10 patch    Refill:  0      Dr. Otilio Miu [facility]  10/19/17

## 2018-02-25 ENCOUNTER — Other Ambulatory Visit: Payer: Self-pay | Admitting: Cardiovascular Disease

## 2018-03-21 ENCOUNTER — Other Ambulatory Visit: Payer: Self-pay | Admitting: Cardiovascular Disease

## 2018-04-19 ENCOUNTER — Ambulatory Visit: Payer: BLUE CROSS/BLUE SHIELD | Admitting: Family Medicine

## 2018-04-19 ENCOUNTER — Ambulatory Visit
Admission: RE | Admit: 2018-04-19 | Discharge: 2018-04-19 | Disposition: A | Discharge status: Home / Self Care | Payer: BLUE CROSS/BLUE SHIELD | Attending: Family Medicine | Admitting: Family Medicine

## 2018-04-19 ENCOUNTER — Encounter: Payer: Self-pay | Admitting: Family Medicine

## 2018-04-19 ENCOUNTER — Ambulatory Visit
Admission: RE | Admit: 2018-04-19 | Discharge: 2018-04-19 | Disposition: A | Discharge status: Home / Self Care | Payer: BLUE CROSS/BLUE SHIELD | Source: Ambulatory Visit | Attending: Family Medicine | Admitting: Family Medicine

## 2018-04-19 VITALS — BP 116/80 | HR 65 | Ht 74.0 in | Wt 249.0 lb

## 2018-04-19 DIAGNOSIS — G5601 Carpal tunnel syndrome, right upper limb: Secondary | ICD-10-CM | POA: Diagnosis not present

## 2018-04-19 DIAGNOSIS — M5010 Cervical disc disorder with radiculopathy, unspecified cervical region: Secondary | ICD-10-CM

## 2018-04-19 DIAGNOSIS — M542 Cervicalgia: Secondary | ICD-10-CM | POA: Diagnosis not present

## 2018-04-19 NOTE — Progress Notes (Signed)
Date:  04/19/2018   Name:  Rodney Briggs   DOB:  [birth year]   MRN:  [id number]   Chief Complaint: Wrist Pain (L) hand/ wrist pain up to elbow. Hand is numb) and ref colonoscopy (no issues)  Wrist Pain   The pain is present in the neck, left hand, left wrist and left arm. This is a chronic problem. The current episode started more than 1 month ago (4 months). There has been no history of extremity trauma. The problem has been unchanged. The quality of the pain is described as aching. The pain is at a severity of 4/10. The pain is moderate. Associated symptoms include numbness and tingling. Pertinent negatives include no fever. Associated symptoms comments: ?weakness. Exacerbated by: arm overhead. Treatments tried: prednisne/aleve. Family history includes gout.  Neck Pain   This is a chronic problem. The current episode started more than 1 year ago. The problem occurs daily. The problem has been gradually worsening. The pain is associated with nothing. The pain is present in the left side. Quality: stabbing radiate left/"shock" The pain is moderate. Associated symptoms include numbness and tingling. Pertinent negatives include no chest pain, fever or headaches.    Review of Systems  Constitutional: Negative for chills and fever.  HENT: Negative for drooling, ear discharge, ear pain and sore throat.   Respiratory: Negative for cough, shortness of breath and wheezing.   Cardiovascular: Negative for chest pain, palpitations and leg swelling.  Gastrointestinal: Negative for abdominal pain, blood in stool, constipation, diarrhea and nausea.  Endocrine: Negative for polydipsia.  Genitourinary: Negative for dysuria, frequency, hematuria and urgency.  Musculoskeletal: Positive for neck pain. Negative for back pain and myalgias.  Skin: Negative for rash.  Allergic/Immunologic: Negative for environmental allergies.  Neurological: Positive for tingling and numbness. Negative for dizziness and  headaches.  Hematological: Does not bruise/bleed easily.  Psychiatric/Behavioral: Negative for suicidal ideas. The patient is not nervous/anxious.     Patient Active Problem List   Diagnosis Date Noted  . Hyperlipidemia LDL goal <70 01/18/2017  . Abnormal nuclear stress test 08/19/2015  . CAD S/P percutaneous coronary angioplasty 08/19/2015  . Unstable angina ([place])   . Mild mitral regurgitation     No Known Allergies  Past Surgical History:  Procedure Laterality Date  . CARDIAC CATHETERIZATION Left 08/19/2015   Procedure: Left Heart Cath and Coronary Angiography;  Surgeon: Leonie Man, MD;  Location: [facility];  Service: Cardiovascular;  Laterality: Left;  . CARDIAC CATHETERIZATION N/A 08/19/2015   Procedure: Coronary Stent Intervention;  Surgeon: Leonie Man, MD;  Location: [facility];  Service: Cardiovascular;  Laterality: N/A;    Social History   Tobacco Use  . Smoking status: Never Smoker  . Smokeless tobacco: Never Used  Substance Use Topics  . Alcohol use: Yes    Alcohol/week: 0.0 standard drinks  . Drug use: No     Medication list has been reviewed and updated.  Current Meds  Medication Sig  . aspirin 81 MG chewable tablet CHEW 1 TABLET BY MOUTH EVERY DAY  . atorvastatin (LIPITOR) 80 MG tablet TAKE 1 TABLET BY MOUTH DAILY AT 6PM (NEED APPT FOR FURTHER REFILLS)  . carvedilol (COREG) 3.125 MG tablet Take 1 tablet (3.125 mg total) by mouth 2 (two) times daily with a meal.  . indomethacin (INDOCIN) 25 MG capsule TAKE 2 CAPSULES (50 MG TOTAL) BY MOUTH 3 TIMES DAILY AS NEEDED FOR MILD PAIN OR MODERATE PAIN.  . nitroGLYCERIN (NITROSTAT)  0.4 MG SL tablet Place 1 tablet (0.4 mg total) under the tongue every 5 (five) minutes as needed for chest pain.  Marland Kitchen omeprazole (PRILOSEC) 40 MG capsule Take 1 capsule (40 mg total) by mouth daily.    PHQ 2/9 Scores 04/19/2018 10/19/2017  PHQ - 2 Score 0 0  PHQ- 9 Score 0 0    Physical Exam Vitals signs and  nursing note reviewed.  Constitutional:      Appearance: He is normal weight.  HENT:     Head: Normocephalic.     Right Ear: Tympanic membrane and external ear normal.     Left Ear: Tympanic membrane and external ear normal.     Nose: Nose normal.  Eyes:     General: No scleral icterus.       Right eye: No discharge.        Left eye: No discharge.     Conjunctiva/sclera: Conjunctivae normal.     Pupils: Pupils are equal, round, and reactive to light.  Neck:     Musculoskeletal: Normal range of motion and neck supple.     Thyroid: No thyromegaly.     Vascular: No JVD.     Trachea: No tracheal deviation.  Cardiovascular:     Rate and Rhythm: Normal rate and regular rhythm.     Heart sounds: Normal heart sounds. No murmur. No friction rub. No gallop.   Pulmonary:     Effort: No respiratory distress.     Breath sounds: Normal breath sounds. No wheezing or rales.  Abdominal:     General: Bowel sounds are normal.     Palpations: Abdomen is soft. There is no mass.     Tenderness: There is no abdominal tenderness. There is no guarding or rebound.  Musculoskeletal: Normal range of motion.        General: No tenderness.     Left wrist: He exhibits normal range of motion, no tenderness, no bony tenderness and no swelling.     Cervical back: He exhibits pain and spasm. He exhibits normal range of motion, no tenderness and no bony tenderness.     Comments: Positive Tinel/Phelen  Lymphadenopathy:     Cervical: No cervical adenopathy.  Skin:    General: Skin is warm.     Findings: No rash.  Neurological:     Mental Status: He is alert and oriented to person, place, and time.     Cranial Nerves: No cranial nerve deficit.     Sensory: Sensation is intact.     Motor: Motor function is intact.     Deep Tendon Reflexes: Reflexes are normal and symmetric.     BP 116/80   Pulse 65   Ht 6\' 2"  (1.88 m)   Wt 249 lb (112.9 kg)   SpO2 99%   BMI 31.97 kg/m   Assessment and Plan:   1.  Cervical disc disorder with radiculopathy Persistent chronic described as a pinch in the back of his neck going down the arm.  There is been no history of trauma will obtain x-ray restarting prednisone taper the below circumstance which may at some of this concern.  Will make a referral to orthopedics for evaluation and treatment of both concerns. - DG Cervical Spine Complete; Future - Ambulatory referral to Orthopedic Surgery  2. Carpal tunnel syndrome of right wrist Also has numbness and discomfort in both wrist hands he took Aleve for couple weeks and the right hand got better but the left hand continued beginning to  think that there may be a combination of carpal tunnel along with a radiculopathy that goes down the arm will have orthopedics take a look to see if we need to do any other evaluation he does have positives Phalen and Tinel signs. - Ambulatory referral to Orthopedic Surgery

## 2018-04-23 DIAGNOSIS — M5412 Radiculopathy, cervical region: Secondary | ICD-10-CM | POA: Diagnosis not present

## 2018-05-12 ENCOUNTER — Other Ambulatory Visit: Payer: Self-pay | Admitting: Cardiovascular Disease

## 2018-05-14 ENCOUNTER — Other Ambulatory Visit: Payer: Self-pay | Admitting: Cardiovascular Disease

## 2018-05-25 ENCOUNTER — Telehealth: Payer: Self-pay | Admitting: *Deleted

## 2018-05-25 NOTE — Telephone Encounter (Signed)

## 2018-05-31 NOTE — Progress Notes (Signed)
Virtual Visit via Video Note   This visit type was conducted due to national recommendations for restrictions regarding the COVID-19 Pandemic (e.g. social distancing) in an effort to limit this patient's exposure and mitigate transmission in our community.  Due to his co-morbid illnesses, this patient is at least at moderate risk for complications without adequate follow up.  This format is felt to be most appropriate for this patient at this time.  All issues noted in this document were discussed and addressed.  A limited physical exam was performed with this format.  Verbal consent was obtained from the patient.  Evaluation Performed:  Follow-up visit  Date:  06/01/2018   ID:  Rodney Briggs, DOB 1965-08-13, MRN [id number]  Patient Location: Home  Provider Location: Office  PCP:  Rodney Patch, MD  Cardiologist:  Rodney Bush, MD Electrophysiologist:  None   Chief Complaint: Follow-up coronary artery disease  History of Present Illness:    Rodney Briggs is a 53 y.o. male who presents via audio/video conferencing for a telehealth visit today.  He has a history of coronary artery disease status post PCI to OM3 (07/2015), mild mitral regurgitation, and GERD.  We are speaking today for follow-up of his coronary artery disease.  I last saw him in 12/2016, at which time he was doing well other than left foot pain due to possible cellulitis.  Today, [name] reports that he is doing well from a heart standpoint.  He denies chest pain, shortness of breath, palpitations, lightheadedness, and edema.  He has experienced some pain and paresthesias in his arms.  He was treated with NSAIDs for possible carpal tunnel syndrome, and notes that one arm has improved.  The other continues to hurt intermittently and seems to be related to a "pinched nerve" in his neck.  The patient does not have symptoms concerning for COVID-19 infection (fever, chills, cough, or new shortness of breath).    Past  Medical History:  Diagnosis Date  . CAD S/P percutaneous coronary angioplasty 08/19/2015   a. treadmill myoview 08/14/15: medium defect of moderate severity in the basal inferior, basal, inferolat, mid inf, and mid inferolat wall. Findings c/w prior MI in LCx dist w/ mild per-infarct ischemia. EF 43%, intermediate risk; b. diagnostic cardiac cath 08/19/15: occluded OM3 s/p PCI/DES with Xience DES 2.25 mm x 18 mm, ramus 40%, inferolateral wall HK c/w occluded OM branch, nl EF, nl LVEDP  . GERD (gastroesophageal reflux disease)   . Gout   . Mild mitral regurgitation    a. echo 08/14/15: EF 60-65%, no RWMA, LV dia fxn nl, mild MR, LA mildly dilated, RV sys fxn nl, PASP nl   Past Surgical History:  Procedure Laterality Date  . CARDIAC CATHETERIZATION Left 08/19/2015   Procedure: Left Heart Cath and Coronary Angiography;  Surgeon: Rodney Man, MD;  Location: [place] [facility];  Service: Cardiovascular;  Laterality: Left;  . CARDIAC CATHETERIZATION N/A 08/19/2015   Procedure: Coronary Stent Intervention;  Surgeon: Rodney Man, MD;  Location: [place] [facility];  Service: Cardiovascular;  Laterality: N/A;     Current Meds  Medication Sig  . atorvastatin (LIPITOR) 80 MG tablet TAKE 1 TABLET BY MOUTH DAILY AT 6PM  . carvedilol (COREG) 3.125 MG tablet Take 1 tablet (3.125 mg total) by mouth 2 (two) times daily with a meal.  . CVS ASPIRIN ADULT LOW DOSE 81 MG chewable tablet CHEW 1 TABLET BY MOUTH EVERY DAY  . nitroGLYCERIN (NITROSTAT) 0.4 MG  SL tablet Place 1 tablet (0.4 mg total) under the tongue every 5 (five) minutes as needed for chest pain.  Marland Kitchen omeprazole (PRILOSEC) 40 MG capsule Take 1 capsule (40 mg total) by mouth daily.  . [DISCONTINUED] atorvastatin (LIPITOR) 80 MG tablet TAKE 1 TABLET BY MOUTH DAILY AT 6PM (NEED APPT FOR FURTHER REFILLS)  . [DISCONTINUED] carvedilol (COREG) 3.125 MG tablet Take 1 tablet (3.125 mg total) by mouth 2 (two) times daily with a meal.  . [DISCONTINUED]  nitroGLYCERIN (NITROSTAT) 0.4 MG SL tablet Place 1 tablet (0.4 mg total) under the tongue every 5 (five) minutes as needed for chest pain.     Allergies:   Patient has no known allergies.   Social History   Tobacco Use  . Smoking status: Never Smoker  . Smokeless tobacco: Never Used  Substance Use Topics  . Alcohol use: Yes    Alcohol/week: 0.0 standard drinks  . Drug use: No     Family Hx: The patient's family history includes Heart disease in his father and mother; Stroke in his mother.  ROS:   Please see the history of present illness.    All other systems reviewed and are negative.   Prior CV studies:   The following studies were reviewed today:  LHC/PCI (08/19/15): LMCA and LAD normal.  Ramus with 40% proximal stenosis.  100% occlusion of OM3.  RCA normal.  Successful PCI to OM3 using Xience 2.25 x 18 mm DES.  Labs/Other Tests and Data Reviewed:    EKG:  No ECG reviewed.  Recent Labs: No results found for requested labs within last 8760 hours.   Recent Lipid Panel Lab Results  Component Value Date/Time   CHOL 128 01/11/2017 08:07 AM   CHOL 168 06/20/2016 08:13 AM   TRIG 78 01/11/2017 08:07 AM   HDL 45 01/11/2017 08:07 AM   HDL 52 06/20/2016 08:13 AM   CHOLHDL 2.8 01/11/2017 08:07 AM   LDLCALC 67 01/11/2017 08:07 AM   LDLCALC 91 06/20/2016 08:13 AM    Wt Readings from Last 3 Encounters:  06/01/18 240 lb (108.9 kg)  04/19/18 249 lb (112.9 kg)  10/19/17 243 lb (110.2 kg)     Objective:    Vital Signs:  Ht _0  (1.88 m)   Wt 240 lb (108.9 kg)   BMI 30.81 kg/m    Well nourished, well developed male in no acute distress.   ASSESSMENT & PLAN:    Coronary artery disease: No symptoms to suggest worsening coronary insufficiency.  Continue current medications for secondary prevention.  We will check a CBC at the patient's convenience once coronavirus precautions have been eased, given long-term antiplatelet therapy.  Hyperlipidemia: LDL at goal on last  check in 2018 (less than 70).  We will continue atorvastatin 80 mg daily and plan to recheck a lipid panel and CMP at the patient's convenience after coronavirus precautions have been eased.  Arm pain: Description is most consistent with neuropathic etiology.  I think it is reasonable for Mr. Hollibaugh to use naproxen as needed for his symptoms.  He should follow-up with his PCP for further evaluation if symptoms persist.  COVID-19 Education: The signs and symptoms of COVID-19 were discussed with the patient and how to seek care for testing (follow up with PCP or arrange E-visit).  The importance of social distancing was discussed today.  Time:   Today, I have spent 12 minutes with the patient with telehealth technology discussing the above problems.     Medication Adjustments/Labs  and Tests Ordered: Current medicines are reviewed at length with the patient today.  Concerns regarding medicines are outlined above.   Tests Ordered: Orders Placed This Encounter  Procedures  . Lipid panel  . CBC with Differential  . Comp Met (CMET)   Medication Changes: Meds ordered this encounter  Medications  . atorvastatin (LIPITOR) 80 MG tablet    Sig: TAKE 1 TABLET BY MOUTH DAILY AT 6PM    Dispense:  90 tablet    Refill:  3  . carvedilol (COREG) 3.125 MG tablet    Sig: Take 1 tablet (3.125 mg total) by mouth 2 (two) times daily with a meal.    Dispense:  180 tablet    Refill:  3  . nitroGLYCERIN (NITROSTAT) 0.4 MG SL tablet    Sig: Place 1 tablet (0.4 mg total) under the tongue every 5 (five) minutes as needed for chest pain.    Dispense:  25 tablet    Refill:  3    Disposition:  Follow up in 6 month(s)  Signed, Rodney Bush, MD  06/01/2018 11:05 AM    [facility]

## 2018-06-01 ENCOUNTER — Telehealth (INDEPENDENT_AMBULATORY_CARE_PROVIDER_SITE_OTHER): Payer: BLUE CROSS/BLUE SHIELD | Admitting: Internal Medicine

## 2018-06-01 ENCOUNTER — Other Ambulatory Visit: Payer: Self-pay

## 2018-06-01 ENCOUNTER — Encounter: Payer: Self-pay | Admitting: Internal Medicine

## 2018-06-01 VITALS — Ht 74.0 in | Wt 240.0 lb

## 2018-06-01 DIAGNOSIS — I2510 Atherosclerotic heart disease of native coronary artery without angina pectoris: Secondary | ICD-10-CM | POA: Diagnosis not present

## 2018-06-01 DIAGNOSIS — M79601 Pain in right arm: Secondary | ICD-10-CM

## 2018-06-01 DIAGNOSIS — E785 Hyperlipidemia, unspecified: Secondary | ICD-10-CM

## 2018-06-01 DIAGNOSIS — M79602 Pain in left arm: Secondary | ICD-10-CM | POA: Insufficient documentation

## 2018-06-01 DIAGNOSIS — Z9861 Coronary angioplasty status: Secondary | ICD-10-CM

## 2018-06-01 MED ORDER — ATORVASTATIN CALCIUM 80 MG PO TABS: ORAL_TABLET | ORAL | 3 refills | Status: DC

## 2018-06-01 MED ORDER — CARVEDILOL 3.125 MG PO TABS: 3.1250 mg | ORAL_TABLET | Freq: Two times a day (BID) | ORAL | 3 refills | Status: DC

## 2018-06-01 MED ORDER — NITROGLYCERIN 0.4 MG SL SUBL: 0.4000 mg | SUBLINGUAL_TABLET | SUBLINGUAL | 3 refills | Status: DC | PRN

## 2018-06-01 NOTE — Patient Instructions (Signed)
Medication Instructions:  Your physician recommends that you continue on your current medications as directed. Please refer to the Current Medication list given to you today.  Your cardiac medications have been refilled today  If you need a refill on your cardiac medications before your next appointment, please call your pharmacy.   Lab work: Your physician recommends that you return for a FASTING lipid profile, cbc, cmet: this can be done at any [facility] at  your earliest convenience due to COVID-19.   If you have labs (blood work) drawn today and your tests are completely normal, you will receive your results only by: Marland Kitchen MyChart Message (if you have MyChart) OR . A paper copy in the mail If you have any lab test that is abnormal or we need to change your treatment, we will call you to review the results.  Testing/Procedures: None ordered  Follow-Up: At [facility], you and your health needs are our priority.  As part of our continuing mission to provide you with exceptional heart care, we have created designated Provider Care Teams.  These Care Teams include your primary Cardiologist (physician) and Advanced Practice Providers (APPs -  Physician Assistants and Nurse Practitioners) who all work together to provide you with the care you need, when you need it.  . Your physician recommends that you schedule a follow-up appointment in: 6 months with Dr.End or an APP

## 2018-06-06 ENCOUNTER — Ambulatory Visit: Payer: BLUE CROSS/BLUE SHIELD | Admitting: Internal Medicine

## 2018-12-01 ENCOUNTER — Other Ambulatory Visit: Payer: Self-pay | Admitting: Family Medicine

## 2018-12-01 DIAGNOSIS — K219 Gastro-esophageal reflux disease without esophagitis: Secondary | ICD-10-CM

## 2018-12-04 ENCOUNTER — Other Ambulatory Visit: Payer: Self-pay

## 2018-12-04 ENCOUNTER — Encounter: Payer: Self-pay | Admitting: Family Medicine

## 2018-12-04 ENCOUNTER — Ambulatory Visit: Payer: BC Managed Care – PPO | Admitting: Family Medicine

## 2018-12-04 VITALS — BP 120/70 | HR 64 | Ht 74.0 in | Wt 247.0 lb

## 2018-12-04 DIAGNOSIS — K219 Gastro-esophageal reflux disease without esophagitis: Secondary | ICD-10-CM | POA: Diagnosis not present

## 2018-12-04 DIAGNOSIS — I2510 Atherosclerotic heart disease of native coronary artery without angina pectoris: Secondary | ICD-10-CM

## 2018-12-04 DIAGNOSIS — R69 Illness, unspecified: Secondary | ICD-10-CM | POA: Diagnosis not present

## 2018-12-04 DIAGNOSIS — E785 Hyperlipidemia, unspecified: Secondary | ICD-10-CM | POA: Diagnosis not present

## 2018-12-04 DIAGNOSIS — Z9861 Coronary angioplasty status: Secondary | ICD-10-CM

## 2018-12-04 DIAGNOSIS — Z23 Encounter for immunization: Secondary | ICD-10-CM

## 2018-12-04 MED ORDER — OMEPRAZOLE 40 MG PO CPDR: DELAYED_RELEASE_CAPSULE | ORAL | 3 refills | Status: DC

## 2018-12-04 NOTE — Progress Notes (Signed)
Date:  12/04/2018   Name:  Rodney Briggs   DOB:  Aug 23, 1965   MRN:  [id number]   Chief Complaint: Hypertension, Hyperlipidemia, Gastroesophageal Reflux, and influenza vacc need  Gastroesophageal Reflux He reports no abdominal pain, no belching, no chest pain, no choking, no coughing, no dysphagia, no early satiety, no globus sensation, no heartburn, no hoarse voice, no nausea, no sore throat, no stridor, no tooth decay, no water brash or no wheezing. This is a chronic problem. The current episode started more than 1 year ago. The problem has been gradually improving. The symptoms are aggravated by certain foods. Pertinent negatives include no anemia, fatigue, melena, muscle weakness, orthopnea or weight loss. Risk factors include obesity.    Review of Systems  Constitutional: Negative for chills, fatigue, fever and weight loss.  HENT: Negative for drooling, ear discharge, ear pain, hoarse voice and sore throat.   Respiratory: Negative for cough, choking, shortness of breath and wheezing.   Cardiovascular: Negative for chest pain, palpitations and leg swelling.  Gastrointestinal: Negative for abdominal pain, blood in stool, constipation, diarrhea, dysphagia, heartburn, melena and nausea.  Endocrine: Negative for polydipsia.  Genitourinary: Negative for dysuria, frequency, hematuria and urgency.  Musculoskeletal: Negative for back pain, myalgias, muscle weakness and neck pain.  Skin: Negative for rash.  Allergic/Immunologic: Negative for environmental allergies.  Neurological: Negative for dizziness and headaches.  Hematological: Does not bruise/bleed easily.  Psychiatric/Behavioral: Negative for suicidal ideas. The patient is not nervous/anxious.     Patient Active Problem List   Diagnosis Date Noted  . Pain in both upper extremities 06/01/2018  . Hyperlipidemia LDL goal <70 01/18/2017  . Abnormal nuclear stress test 08/19/2015  . CAD S/P percutaneous coronary angioplasty  08/19/2015  . Unstable angina ([place])   . Mild mitral regurgitation     No Known Allergies  Past Surgical History:  Procedure Laterality Date  . CARDIAC CATHETERIZATION Left 08/19/2015   Procedure: Left Heart Cath and Coronary Angiography;  Surgeon: Leonie Man, MD;  Location: [place] [facility];  Service: Cardiovascular;  Laterality: Left;  . CARDIAC CATHETERIZATION N/A 08/19/2015   Procedure: Coronary Stent Intervention;  Surgeon: Leonie Man, MD;  Location: [place] [facility];  Service: Cardiovascular;  Laterality: N/A;    Social History   Tobacco Use  . Smoking status: Never Smoker  . Smokeless tobacco: Never Used  Substance Use Topics  . Alcohol use: Yes    Alcohol/week: 0.0 standard drinks  . Drug use: No     Medication list has been reviewed and updated.  Current Meds  Medication Sig  . atorvastatin (LIPITOR) 80 MG tablet TAKE 1 TABLET BY MOUTH DAILY AT 6PM  . carvedilol (COREG) 3.125 MG tablet Take 1 tablet (3.125 mg total) by mouth 2 (two) times daily with a meal.  . CVS ASPIRIN ADULT LOW DOSE 81 MG chewable tablet CHEW 1 TABLET BY MOUTH EVERY DAY  . nitroGLYCERIN (NITROSTAT) 0.4 MG SL tablet Place 1 tablet (0.4 mg total) under the tongue every 5 (five) minutes as needed for chest pain.  Marland Kitchen omeprazole (PRILOSEC) 40 MG capsule TAKE 1 CAPSULE BY MOUTH EVERY DAY    PHQ 2/9 Scores 04/19/2018 10/19/2017  PHQ - 2 Score 0 0  PHQ- 9 Score 0 0    BP Readings from Last 3 Encounters:  12/04/18 120/70  04/19/18 116/80  10/19/17 120/70    Physical Exam Vitals signs and nursing note reviewed.  HENT:     Head: Normocephalic.  Right Ear: Tympanic membrane, ear canal and external ear normal.     Left Ear: Tympanic membrane, ear canal and external ear normal.     Nose: Nose normal. No congestion or rhinorrhea.     Mouth/Throat:     Mouth: Mucous membranes are moist.  Eyes:     General: No scleral icterus.       Right eye: No discharge.        Left eye: No  discharge.     Conjunctiva/sclera: Conjunctivae normal.     Pupils: Pupils are equal, round, and reactive to light.  Neck:     Musculoskeletal: Normal range of motion and neck supple.     Thyroid: No thyromegaly.     Vascular: No JVD.     Trachea: No tracheal deviation.  Cardiovascular:     Rate and Rhythm: Normal rate and regular rhythm.     Heart sounds: Normal heart sounds. No murmur. No friction rub. No gallop.   Pulmonary:     Effort: No respiratory distress.     Breath sounds: Normal breath sounds. No wheezing, rhonchi or rales.  Abdominal:     General: Bowel sounds are normal.     Palpations: Abdomen is soft. There is no mass.     Tenderness: There is no abdominal tenderness. There is no guarding or rebound.  Musculoskeletal: Normal range of motion.        General: No tenderness.  Lymphadenopathy:     Cervical: No cervical adenopathy.  Skin:    General: Skin is warm.     Capillary Refill: Capillary refill takes less than 2 seconds.     Coloration: Skin is not jaundiced.     Findings: No bruising, erythema, lesion or rash.  Neurological:     Mental Status: He is alert and oriented to person, place, and time.     Cranial Nerves: No cranial nerve deficit.     Deep Tendon Reflexes: Reflexes are normal and symmetric.     Wt Readings from Last 3 Encounters:  12/04/18 247 lb (112 kg)  06/01/18 240 lb (108.9 kg)  04/19/18 249 lb (112.9 kg)    BP 120/70   Pulse 64   Ht 6\' 2"  (1.88 m)   Wt 247 lb (112 kg)   BMI 31.71 kg/m   Assessment and Plan: 1. Gastroesophageal reflux disease, unspecified whether esophagitis present Chronic.  Controlled.  Stable.  Continue omeprazole 40 mg once a day. - omeprazole (PRILOSEC) 40 MG capsule; TAKE 1 CAPSULE BY MOUTH EVERY DAY  Dispense: 90 capsule; Refill: 3  2. CAD S/P percutaneous coronary angioplasty Patient is followed by heart care/Dr. in is his cardiologist.  Patient is followed for coronary artery disease having received PCI  on 617 to OM 3.  Patient was noted to have had lab work and this was called in for his upcoming appointment that we have made a referral for.  This includes CMP CBC and a lipid panel.  As noted patient appointment has been made with Dr. Saunders Revel and heart care. - Comprehensive metabolic panel - CBC with Differential/Platelet - Ambulatory referral to Cardiology  3. Hyperlipidemia LDL goal <70 Chronic.  Controlled.  Stable.  Will check lipid panel fasting tomorrow. - Lipid Panel With LDL/HDL Ratio  4. Taking medication for chronic disease Patient is on a statin and we will check CBC along with CMP for any side effect possibilities. - Comprehensive metabolic panel - CBC with Differential/Platelet  5. Influenza vaccine needed Discussed and administered -  Flu Vaccine QUAD 6+ mos PF IM (Fluarix Quad PF)

## 2018-12-05 DIAGNOSIS — I2510 Atherosclerotic heart disease of native coronary artery without angina pectoris: Secondary | ICD-10-CM | POA: Diagnosis not present

## 2018-12-05 DIAGNOSIS — R69 Illness, unspecified: Secondary | ICD-10-CM | POA: Diagnosis not present

## 2018-12-05 DIAGNOSIS — Z9861 Coronary angioplasty status: Secondary | ICD-10-CM | POA: Diagnosis not present

## 2018-12-05 DIAGNOSIS — E785 Hyperlipidemia, unspecified: Secondary | ICD-10-CM | POA: Diagnosis not present

## 2018-12-06 LAB — COMPREHENSIVE METABOLIC PANEL
ALT: 20 IU/L (ref 0–44)
AST: 29 IU/L (ref 0–40)
Albumin/Globulin Ratio: 2 (ref 1.2–2.2)
Albumin: 4.5 g/dL (ref 3.8–4.9)
Alkaline Phosphatase: 77 IU/L (ref 39–117)
BUN/Creatinine Ratio: 13 (ref 9–20)
BUN: 13 mg/dL (ref 6–24)
Bilirubin Total: 0.6 mg/dL (ref 0.0–1.2)
CO2: 23 mmol/L (ref 20–29)
Calcium: 9.5 mg/dL (ref 8.7–10.2)
Chloride: 105 mmol/L (ref 96–106)
Creatinine, Ser: 1.03 mg/dL (ref 0.76–1.27)
GFR calc Af Amer: 95 mL/min/{1.73_m2} (ref >59)
GFR calc non Af Amer: 83 mL/min/{1.73_m2} (ref >59)
Globulin, Total: 2.2 g/dL (ref 1.5–4.5)
Glucose: 95 mg/dL (ref 65–99)
Potassium: 4.8 mmol/L (ref 3.5–5.2)
Sodium: 140 mmol/L (ref 134–144)
Total Protein: 6.7 g/dL (ref 6.0–8.5)

## 2018-12-06 LAB — CBC WITH DIFFERENTIAL/PLATELET
Basophils Absolute: 0.1 10*3/uL (ref 0.0–0.2)
Basos: 1 %
EOS (ABSOLUTE): 0.1 10*3/uL (ref 0.0–0.4)
Eos: 1 %
Hematocrit: 43.3 % (ref 37.5–51.0)
Hemoglobin: 14.7 g/dL (ref 13.0–17.7)
Immature Grans (Abs): 0 10*3/uL (ref 0.0–0.1)
Immature Granulocytes: 0 %
Lymphocytes Absolute: 1.8 10*3/uL (ref 0.7–3.1)
Lymphs: 26 %
MCH: 30 pg (ref 26.6–33.0)
MCHC: 33.9 g/dL (ref 31.5–35.7)
MCV: 88 fL (ref 79–97)
Monocytes Absolute: 0.7 10*3/uL (ref 0.1–0.9)
Monocytes: 10 %
Neutrophils Absolute: 4.4 10*3/uL (ref 1.4–7.0)
Neutrophils: 62 %
Platelets: 209 10*3/uL (ref 150–450)
RBC: 4.9 x10E6/uL (ref 4.14–5.80)
RDW: 13.3 % (ref 11.6–15.4)
WBC: 7.1 10*3/uL (ref 3.4–10.8)

## 2018-12-06 LAB — LIPID PANEL WITH LDL/HDL RATIO
Cholesterol, Total: 146 mg/dL (ref 100–199)
HDL: 54 mg/dL (ref >39)
LDL Chol Calc (NIH): 74 mg/dL (ref 0–99)
LDL/HDL Ratio: 1.4 ratio (ref 0.0–3.6)
Triglycerides: 97 mg/dL (ref 0–149)
VLDL Cholesterol Cal: 18 mg/dL (ref 5–40)

## 2018-12-10 NOTE — Progress Notes (Signed)
Follow-up Outpatient Visit Date: 12/12/2018  Primary Care Provider: Juline Patch, MD [address] [place] [address]  Chief Complaint: Follow-up coronary artery disease  HPI:  [name] is a 53 y.o. year-old male with history of coronary artery disease status post PCI to OM3 (07/2015), mild mitral regurgitation, and GERD, who presents for follow-up of coronary artery disease.  We last spoke in April, at which time he was doing well.  His only complaint was of intermittent paresthesias in his arms.  He denied cardiac symptoms.  No medication changes were made.  Today, [name] reports that he is feeling well.  His arm paresthesias resolved spontaneously.  He denies chest pain, shortness of breath, palpitations, lightheadedness, orthopnea, PND, and edema.  He is tolerating his medications well.  He does not regularly check his blood pressure at home.  He does not follow a low-sodium diet.  --------------------------------------------------------------------------------------------------  Past Medical History:  Diagnosis Date  . CAD S/P percutaneous coronary angioplasty 08/19/2015   a. treadmill myoview 08/14/15: medium defect of moderate severity in the basal inferior, basal, inferolat, mid inf, and mid inferolat wall. Findings c/w prior MI in LCx dist w/ mild per-infarct ischemia. EF 43%, intermediate risk; b. diagnostic cardiac cath 08/19/15: occluded OM3 s/p PCI/DES with Xience DES 2.25 mm x 18 mm, ramus 40%, inferolateral wall HK c/w occluded OM branch, nl EF, nl LVEDP  . GERD (gastroesophageal reflux disease)   . Gout   . Mild mitral regurgitation    a. echo 08/14/15: EF 60-65%, no RWMA, LV dia fxn nl, mild MR, LA mildly dilated, RV sys fxn nl, PASP nl   Past Surgical History:  Procedure Laterality Date  . CARDIAC CATHETERIZATION Left 08/19/2015   Procedure: Left Heart Cath and Coronary Angiography;  Surgeon: Leonie Man, MD;  Location: [place] [facility];   Service: Cardiovascular;  Laterality: Left;  . CARDIAC CATHETERIZATION N/A 08/19/2015   Procedure: Coronary Stent Intervention;  Surgeon: Leonie Man, MD;  Location: [place] [facility];  Service: Cardiovascular;  Laterality: N/A;    Allergies: Patient has no known allergies.  Social History   Tobacco Use  . Smoking status: Never Smoker  . Smokeless tobacco: Never Used  Substance Use Topics  . Alcohol use: Yes    Alcohol/week: 0.0 standard drinks  . Drug use: No    Family History  Problem Relation Age of Onset  . Heart disease Mother   . Stroke Mother   . Heart disease Father     Review of Systems: A 12-system review of systems was performed and was negative except as noted in the HPI.  --------------------------------------------------------------------------------------------------  Physical Exam: BP (!) 148/90 (BP Location: Left Arm, Patient Position: Sitting, Cuff Size: Normal)   Pulse 66   Ht 6\' 2"  (1.88 m)   Wt 249 lb 4 oz (113.1 kg)   SpO2 98%   BMI 32.00 kg/m   General: NAD. HEENT: No conjunctival pallor or scleral icterus. Neck: Supple without lymphadenopathy, thyromegaly, JVD, or HJR. Lungs: Normal work of breathing. Clear to auscultation bilaterally without wheezes or crackles. Heart: Regular rate and rhythm without murmurs, rubs, or gallops. Non-displaced PMI. Abd: Bowel sounds present. Soft, NT/ND without hepatosplenomegaly Ext: No lower extremity edema. Radial, PT, and DP pulses are 2+ bilaterally. Skin: Warm and dry without rash.  EKG: Normal sinus rhythm with low voltage.  Otherwise, no significant abnormality  Lab Results  Component Value Date   WBC 7.1 12/05/2018   HGB  14.7 12/05/2018   HCT 43.3 12/05/2018   MCV 88 12/05/2018   PLT 209 12/05/2018    Lab Results  Component Value Date   NA 140 12/05/2018   K 4.8 12/05/2018   CL 105 12/05/2018   CO2 23 12/05/2018   BUN 13 12/05/2018   CREATININE 1.03 12/05/2018   GLUCOSE 95  12/05/2018   ALT 20 12/05/2018    Lab Results  Component Value Date   CHOL 146 12/05/2018   HDL 54 12/05/2018   LDLCALC 74 12/05/2018   TRIG 97 12/05/2018   CHOLHDL 2.8 01/11/2017    --------------------------------------------------------------------------------------------------  ASSESSMENT AND PLAN: Coronary artery disease without angina: [name] continues to do well without symptoms to suggest worsening coronary insufficiency.  We will continue his current medications for secondary prevention, including aspirin, atorvastatin, and carvedilol.  Elevated blood pressure: Blood pressure suboptimally controlled today, though blood pressures are typically normal.  We discussed escalation of antihypertensive regimen (currently only on carvedilol).  However, [name] would like to work on lifestyle modifications.  I encouraged him to lose weight through diet and exercise as well as to minimize his sodium intake.  Hyperlipidemia: LDL borderline on most recent check (74).  As he is already on maximum dose of atorvastatin, we will defer medication changes today and instead work on lifestyle modifications.  If LDL remains above 70, addition of ezetimibe versus a PCSK9 inhibitor will need to be considered in the future.  Follow-up: Return to clinic in 1 year.  Nelva Bush, MD 12/13/2018 4:58 PM

## 2018-12-12 ENCOUNTER — Ambulatory Visit: Payer: BC Managed Care – PPO | Admitting: Internal Medicine

## 2018-12-12 ENCOUNTER — Other Ambulatory Visit: Payer: Self-pay

## 2018-12-12 ENCOUNTER — Encounter: Payer: Self-pay | Admitting: Internal Medicine

## 2018-12-12 VITALS — BP 148/90 | HR 66 | Ht 74.0 in | Wt 249.2 lb

## 2018-12-12 DIAGNOSIS — I2510 Atherosclerotic heart disease of native coronary artery without angina pectoris: Secondary | ICD-10-CM | POA: Diagnosis not present

## 2018-12-12 DIAGNOSIS — E785 Hyperlipidemia, unspecified: Secondary | ICD-10-CM | POA: Diagnosis not present

## 2018-12-12 DIAGNOSIS — R030 Elevated blood-pressure reading, without diagnosis of hypertension: Secondary | ICD-10-CM

## 2018-12-12 NOTE — Patient Instructions (Signed)
Medication Instructions:  Your physician recommends that you continue on your current medications as directed. Please refer to the Current Medication list given to you today.  *If you need a refill on your cardiac medications before your next appointment, please call your pharmacy*  Lab Work: NONE If you have labs (blood work) drawn today and your tests are completely normal, you will receive your results only by: Marland Kitchen MyChart Message (if you have MyChart) OR . A paper copy in the mail If you have any lab test that is abnormal or we need to change your treatment, we will call you to review the results.  Testing/Procedures: NONE  Follow-Up: At [facility], you and your health needs are our priority.  As part of our continuing mission to provide you with exceptional heart care, we have created designated Provider Care Teams.  These Care Teams include your primary Cardiologist (physician) and Advanced Practice Providers (APPs -  Physician Assistants and Nurse Practitioners) who all work together to provide you with the care you need, when you need it.  Your next appointment:   12 months  The format for your next appointment:   In Person  Provider:    You may see Nelva Bush, MD or one of the following Advanced Practice Providers on your designated Care Team:    Aven Hodgkins, NP  Christell Faith, PA-C  Marrianne Mood, PA-C   Other Instructions DASH DIET HANDOUT

## 2018-12-13 ENCOUNTER — Encounter: Payer: Self-pay | Admitting: Internal Medicine

## 2018-12-13 DIAGNOSIS — R030 Elevated blood-pressure reading, without diagnosis of hypertension: Secondary | ICD-10-CM | POA: Insufficient documentation

## 2018-12-13 DIAGNOSIS — I10 Essential (primary) hypertension: Secondary | ICD-10-CM | POA: Insufficient documentation

## 2018-12-21 ENCOUNTER — Other Ambulatory Visit: Payer: Self-pay

## 2018-12-21 DIAGNOSIS — M109 Gout, unspecified: Secondary | ICD-10-CM

## 2018-12-21 MED ORDER — INDOMETHACIN 25 MG PO CAPS
25.0000 mg | ORAL_CAPSULE | Freq: Two times a day (BID) | ORAL | 0 refills | Status: DC
Start: 2018-12-21 — End: 2019-01-28

## 2018-12-21 NOTE — Progress Notes (Unsigned)
Put in gout med

## 2019-01-28 ENCOUNTER — Other Ambulatory Visit: Payer: Self-pay | Admitting: Internal Medicine

## 2019-01-28 ENCOUNTER — Other Ambulatory Visit: Payer: Self-pay | Admitting: Family Medicine

## 2019-01-28 DIAGNOSIS — M109 Gout, unspecified: Secondary | ICD-10-CM

## 2019-02-24 ENCOUNTER — Other Ambulatory Visit: Payer: Self-pay | Admitting: Family Medicine

## 2019-02-24 DIAGNOSIS — M109 Gout, unspecified: Secondary | ICD-10-CM

## 2019-03-24 ENCOUNTER — Other Ambulatory Visit: Payer: Self-pay | Admitting: Family Medicine

## 2019-03-24 DIAGNOSIS — M109 Gout, unspecified: Secondary | ICD-10-CM

## 2019-04-20 ENCOUNTER — Other Ambulatory Visit: Payer: Self-pay | Admitting: Family Medicine

## 2019-04-20 DIAGNOSIS — M109 Gout, unspecified: Secondary | ICD-10-CM

## 2019-04-21 ENCOUNTER — Other Ambulatory Visit: Payer: Self-pay | Admitting: Internal Medicine

## 2019-05-15 ENCOUNTER — Other Ambulatory Visit: Payer: Self-pay | Admitting: Family Medicine

## 2019-05-15 DIAGNOSIS — M109 Gout, unspecified: Secondary | ICD-10-CM

## 2019-05-26 ENCOUNTER — Other Ambulatory Visit: Payer: Self-pay | Admitting: Internal Medicine

## 2019-05-27 ENCOUNTER — Encounter: Payer: Self-pay | Admitting: Family Medicine

## 2019-05-27 ENCOUNTER — Other Ambulatory Visit: Payer: Self-pay

## 2019-05-27 ENCOUNTER — Ambulatory Visit: Payer: BC Managed Care – PPO | Admitting: Family Medicine

## 2019-05-27 DIAGNOSIS — K219 Gastro-esophageal reflux disease without esophagitis: Secondary | ICD-10-CM | POA: Diagnosis not present

## 2019-05-27 MED ORDER — OMEPRAZOLE 40 MG PO CPDR: DELAYED_RELEASE_CAPSULE | ORAL | 3 refills | Status: DC

## 2019-05-27 NOTE — Progress Notes (Signed)
Date:  05/27/2019   Name:  Rodney Briggs   DOB:  [birth year]   MRN:  [id number]   Chief Complaint: Gastroesophageal Reflux and Gout  Gastroesophageal Reflux He reports no abdominal pain, no belching, no chest pain, no choking, no coughing, no dysphagia, no early satiety, no globus sensation, no heartburn, no hoarse voice, no nausea, no sore throat, no stridor, no tooth decay or no wheezing. This is a chronic problem. The current episode started more than 1 year ago. The problem occurs occasionally. The problem has been gradually improving. Nothing aggravates the symptoms. Pertinent negatives include no anemia, fatigue, melena, muscle weakness, orthopnea or weight loss. There are no known risk factors. He has tried a PPI for the symptoms. The treatment provided moderate relief.    Lab Results  Component Value Date   CREATININE 1.03 12/05/2018   BUN 13 12/05/2018   NA 140 12/05/2018   K 4.8 12/05/2018   CL 105 12/05/2018   CO2 23 12/05/2018   Lab Results  Component Value Date   CHOL 146 12/05/2018   HDL 54 12/05/2018   LDLCALC 74 12/05/2018   TRIG 97 12/05/2018   CHOLHDL 2.8 01/11/2017   No results found for: TSH No results found for: HGBA1C Lab Results  Component Value Date   WBC 7.1 12/05/2018   HGB 14.7 12/05/2018   HCT 43.3 12/05/2018   MCV 88 12/05/2018   PLT 209 12/05/2018   Lab Results  Component Value Date   ALT 20 12/05/2018   AST 29 12/05/2018   ALKPHOS 77 12/05/2018   BILITOT 0.6 12/05/2018     Review of Systems  Constitutional: Negative for chills, fatigue, fever and weight loss.  HENT: Negative for drooling, ear discharge, ear pain, hoarse voice and sore throat.   Respiratory: Negative for cough, choking, shortness of breath and wheezing.   Cardiovascular: Negative for chest pain, palpitations and leg swelling.  Gastrointestinal: Negative for abdominal pain, blood in stool, constipation, diarrhea, dysphagia, heartburn, melena and nausea.  Endocrine:  Negative for polydipsia.  Genitourinary: Negative for dysuria, frequency, hematuria and urgency.  Musculoskeletal: Negative for back pain, myalgias, muscle weakness and neck pain.  Skin: Negative for rash.  Allergic/Immunologic: Negative for environmental allergies.  Neurological: Negative for dizziness and headaches.  Hematological: Does not bruise/bleed easily.  Psychiatric/Behavioral: Negative for suicidal ideas. The patient is not nervous/anxious.     Patient Active Problem List   Diagnosis Date Noted  . Elevated blood pressure reading 12/13/2018  . Pain in both upper extremities 06/01/2018  . Hyperlipidemia LDL goal <70 01/18/2017  . Abnormal nuclear stress test 08/19/2015  . CAD S/P percutaneous coronary angioplasty 08/19/2015  . Mild mitral regurgitation     No Known Allergies  Past Surgical History:  Procedure Laterality Date  . CARDIAC CATHETERIZATION Left 08/19/2015   Procedure: Left Heart Cath and Coronary Angiography;  Surgeon: Leonie Man, MD;  Location: [facility];  Service: Cardiovascular;  Laterality: Left;  . CARDIAC CATHETERIZATION N/A 08/19/2015   Procedure: Coronary Stent Intervention;  Surgeon: Leonie Man, MD;  Location: [facility];  Service: Cardiovascular;  Laterality: N/A;    Social History   Tobacco Use  . Smoking status: Never Smoker  . Smokeless tobacco: Never Used  Substance Use Topics  . Alcohol use: Yes    Alcohol/week: 0.0 standard drinks  . Drug use: No     Medication list has been reviewed and updated.  Current Meds  Medication Sig  .  atorvastatin (LIPITOR) 80 MG tablet TAKE 1 TABLET BY MOUTH DAILY AT 6PM  . carvedilol (COREG) 3.125 MG tablet Take 1 tablet (3.125 mg total) by mouth 2 (two) times daily with a meal.  . [facility] ASPIRIN ADULT LOW DOSE 81 MG chewable tablet CHEW 1 TABLET BY MOUTH EVERY DAY  . indomethacin (INDOCIN) 25 MG capsule TAKE 1 CAPSULE (25 MG TOTAL) BY MOUTH 2 (TWO) TIMES DAILY WITH A MEAL.    . nitroGLYCERIN (NITROSTAT) 0.4 MG SL tablet Place 1 tablet (0.4 mg total) under the tongue every 5 (five) minutes as needed for chest pain.  Marland Kitchen omeprazole (PRILOSEC) 40 MG capsule TAKE 1 CAPSULE BY MOUTH EVERY DAY    PHQ 2/9 Scores 05/27/2019 04/19/2018 10/19/2017  PHQ - 2 Score 0 0 0  PHQ- 9 Score 0 0 0    BP Readings from Last 3 Encounters:  05/27/19 132/80  12/12/18 (!) 148/90  12/04/18 120/70    Physical Exam Vitals and nursing note reviewed.  HENT:     Head: Normocephalic.     Right Ear: Tympanic membrane, ear canal and external ear normal.     Left Ear: Tympanic membrane, ear canal and external ear normal.     Nose: Nose normal. No congestion or rhinorrhea.     Mouth/Throat:     Mouth: Mucous membranes are moist.  Eyes:     General: No scleral icterus.       Right eye: No discharge.        Left eye: No discharge.     Conjunctiva/sclera: Conjunctivae normal.     Pupils: Pupils are equal, round, and reactive to light.  Neck:     Thyroid: No thyromegaly.     Vascular: No JVD.     Trachea: No tracheal deviation.  Cardiovascular:     Rate and Rhythm: Normal rate and regular rhythm.     Heart sounds: Normal heart sounds. No murmur. No friction rub. No gallop.   Pulmonary:     Effort: No respiratory distress.     Breath sounds: Normal breath sounds. No wheezing, rhonchi or rales.  Chest:     Chest wall: No tenderness.  Abdominal:     General: Bowel sounds are normal.     Palpations: Abdomen is soft. There is no mass.     Tenderness: There is no abdominal tenderness. There is no guarding or rebound.  Musculoskeletal:        General: No tenderness. Normal range of motion.     Cervical back: Normal range of motion and neck supple.  Lymphadenopathy:     Cervical: No cervical adenopathy.  Skin:    General: Skin is warm.     Capillary Refill: Capillary refill takes less than 2 seconds.     Coloration: Skin is not jaundiced or pale.     Findings: No rash.  Neurological:      Mental Status: He is alert and oriented to person, place, and time.     Cranial Nerves: No cranial nerve deficit.     Deep Tendon Reflexes: Reflexes are normal and symmetric.     Wt Readings from Last 3 Encounters:  05/27/19 253 lb (114.8 kg)  12/12/18 249 lb 4 oz (113.1 kg)  12/04/18 247 lb (112 kg)    BP 132/80   Pulse 64   Ht 6\' 2"  (1.88 m)   Wt 253 lb (114.8 kg)   BMI 32.48 kg/m   Assessment and Plan:  1. Gastroesophageal reflux disease, unspecified whether  esophagitis present Chronic.  Controlled.  Stable.  Patient will continue omeprazole 40 mg once a day.  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  Blood pressure is followed by cardiology.  We will recheck patient in 1 year. - omeprazole (PRILOSEC) 40 MG capsule; TAKE 1 CAPSULE BY MOUTH EVERY DAY  Dispense: 90 capsule; Refill: 3

## 2019-06-10 ENCOUNTER — Telehealth: Payer: Self-pay | Admitting: Internal Medicine

## 2019-06-10 DIAGNOSIS — I2510 Atherosclerotic heart disease of native coronary artery without angina pectoris: Secondary | ICD-10-CM

## 2019-06-10 NOTE — Telephone Encounter (Signed)
Patient dropped off DOR clearance form .  Placed in nurse box. Fax to patient [phone] and call when complete [phone]

## 2019-06-12 NOTE — Telephone Encounter (Signed)
Dr End reviewed the form from patient.  Patient will need a GXT then follow up after with Christell Faith, PA.

## 2019-06-12 NOTE — Telephone Encounter (Signed)
No answer. Left message to call back.   

## 2019-06-12 NOTE — Telephone Encounter (Signed)
Patient calling back and verbalized understanding of recommendations for treadmill stress test. He is aware of the preprocedural instructions:  DO NOT drink or eat foods with caffeine for 24 hours before the test. (Chocolate, coffee, tea, decaf coffee/tea, or energy drinks)  DO NOT smoke for 4 hours before your test.  If you use an inhaler, bring it with you to the test.  Wear comfortable shoes and clothing. Women do not wear dresses.  He is aware someone will call him back to scheduled the treadmill stress test, pre COVID test and follow up with Christell Faith, PA-C after test. He needs within the next 30 days.

## 2019-06-14 ENCOUNTER — Other Ambulatory Visit: Payer: Self-pay | Admitting: Family Medicine

## 2019-06-14 DIAGNOSIS — M109 Gout, unspecified: Secondary | ICD-10-CM

## 2019-06-17 NOTE — Telephone Encounter (Signed)
Scheduled and mailed instructions

## 2019-07-01 ENCOUNTER — Other Ambulatory Visit
Admission: RE | Admit: 2019-07-01 | Discharge: 2019-07-01 | Disposition: A | Discharge status: Home / Self Care | Payer: BC Managed Care – PPO | Source: Ambulatory Visit | Attending: Internal Medicine | Admitting: Internal Medicine

## 2019-07-01 DIAGNOSIS — Z20822 Contact with and (suspected) exposure to covid-19: Secondary | ICD-10-CM | POA: Insufficient documentation

## 2019-07-01 DIAGNOSIS — Z01812 Encounter for preprocedural laboratory examination: Secondary | ICD-10-CM | POA: Insufficient documentation

## 2019-07-01 LAB — SARS CORONAVIRUS 2 (TAT 6-24 HRS): SARS Coronavirus 2: NEGATIVE

## 2019-07-02 ENCOUNTER — Other Ambulatory Visit: Payer: Self-pay

## 2019-07-02 ENCOUNTER — Telehealth: Payer: Self-pay | Admitting: *Deleted

## 2019-07-02 ENCOUNTER — Ambulatory Visit (INDEPENDENT_AMBULATORY_CARE_PROVIDER_SITE_OTHER): Payer: BC Managed Care – PPO

## 2019-07-02 DIAGNOSIS — I2510 Atherosclerotic heart disease of native coronary artery without angina pectoris: Secondary | ICD-10-CM

## 2019-07-02 LAB — EXERCISE TOLERANCE TEST
Estimated workload: 10.1 METS
Exercise duration (min): 8 min
Exercise duration (sec): 37 s
MPHR: 167 {beats}/min
Peak HR: 157 {beats}/min
Percent HR: 94 %
RPE: 17
Rest HR: 74 {beats}/min

## 2019-07-02 NOTE — Telephone Encounter (Signed)
-----   Message from Nelva Bush, MD sent at 07/02/2019  3:48 PM EDT ----- Please let Mr. Picardo know that his stress test is normal.  He should follow-up with Christell Faith, PA, at his earliest convenience to complete cardiac clearance.

## 2019-07-02 NOTE — Telephone Encounter (Signed)
No answer. Left message to call back.   

## 2019-07-03 NOTE — Progress Notes (Signed)
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 male with history of CAD s/p PCI to [place] in 07/2015, mild mitral regurgitation, and GERD who presents for follow up of his CAD.   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 Overall, this was an intermediate risk study.  Subsequent diagnotic LHC on 08/19/2015 that showed an occluded OM3 which was treated successfully with PCI/DES, along with 40% ramus stenosis, normal EF and LVEDP.  Echo at that time showed an EF of 60-65%, no RWMA, normal LV diastolic function, mild mitral regurgitation, mildly dilated left atrium, normal RVSF and PASP. He was last seen in the office in 11/2018 for follow up and was doing well from a cardiac perspective with noted resolved arm paresthesias and was tolerating all medications. 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 There were no EKG changes consistent with ischemia. Overall, this was a low risk study.   He comes in doing very well from a cardiac perspective.  He denies any chest pain, dyspnea, palpitations, dizziness, presyncope, syncope, falls, hematochezia, or melena.  No lower extremity swelling, abdominal distention, orthopnea, PND, or early satiety.  He is tolerating all medications without issues.  He has not needed any sublingual nitroglycerin.  He does not  check his blood pressure at home.  Recent BP readings noted in epic range from the Q000111Q to Q000111Q systolic.  He does not have any issues or concerns to discuss at this time.   Labs independently reviewed: 11/2018 - HGB 14.7, PLT 209, TC 146, TG 97, HDL 54, LDL 74, potassium 4.8, BUN 13, SCr 1.03, albumin 4.5, AST/ALT normal  Past Medical History:  Diagnosis Date  . CAD S/P percutaneous coronary angioplasty 08/19/2015   a. treadmill myoview 08/14/15: medium defect of moderate severity in the basal inferior, basal, inferolat, mid inf, and mid inferolat wall. Findings c/w prior MI in LCx dist w/ mild per-infarct ischemia. EF 43%, intermediate risk; b. diagnostic cardiac cath 08/19/15: occluded OM3 s/p PCI/DES with Xience DES 2.25 mm x 18 mm, ramus 40%, inferolateral wall HK c/w occluded OM branch, nl EF, nl LVEDP  . GERD (gastroesophageal reflux disease)   . Gout   . Mild mitral regurgitation    a. echo 08/14/15: EF 60-65%, no RWMA, LV dia fxn nl, mild MR, LA mildly dilated, RV sys fxn nl, PASP nl    Past Surgical History:  Procedure Laterality Date  . CARDIAC CATHETERIZATION Left 08/19/2015   Procedure: Left Heart Cath and Coronary Angiography;  Surgeon: Leonie Man, MD;  Location: [facility];  Service: Cardiovascular;  Laterality: Left;  . CARDIAC CATHETERIZATION N/A 08/19/2015   Procedure: Coronary Stent Intervention;  Surgeon: Leonie Man, MD;  Location: [facility];  Service: Cardiovascular;  Laterality: N/A;    Current Medications: Current Meds  Medication Sig  . atorvastatin (LIPITOR) 80 MG tablet TAKE 1 TABLET BY MOUTH DAILY AT 6PM  . carvedilol (COREG) 6.25 MG tablet Take 1 tablet (6.25 mg total) by mouth 2 (two) times daily with a meal.  . [facility] ASPIRIN ADULT LOW DOSE 81 MG chewable tablet CHEW 1 TABLET BY MOUTH EVERY DAY  . indomethacin (INDOCIN) 25 MG capsule TAKE 1 CAPSULE (25 MG TOTAL) BY MOUTH 2 (TWO) TIMES DAILY WITH A MEAL.  . nitroGLYCERIN (NITROSTAT)  0.4 MG SL tablet Place 1 tablet (0.4 mg total) under the tongue every 5 (five) minutes as needed for chest pain.  Marland Kitchen omeprazole (PRILOSEC) 40 MG capsule TAKE 1 CAPSULE BY MOUTH EVERY DAY  . [DISCONTINUED] carvedilol (COREG) 3.125 MG tablet TAKE 1 TABLET (3.125 MG TOTAL) BY MOUTH 2 (TWO) TIMES DAILY WITH A MEAL.    Allergies:   Patient has no known allergies.   Social History   Socioeconomic History  . Marital status: Divorced    Spouse name: Not on file  . Number of children: Not on file  . Years of education: Not on file  . Highest education level: Not on file  Occupational History  . Not on file  Tobacco Use  . Smoking status: Never Smoker  . Smokeless tobacco: Never Used  Substance and Sexual Activity  . Alcohol use: Yes    Alcohol/week: 0.0 standard drinks  . Drug use: No  . Sexual activity: Yes  Other Topics Concern  . Not on file  Social History Narrative  . Not on file   Social Determinants of Health   Financial Resource Strain:   . Difficulty of Paying Living Expenses:   Food Insecurity:   . Worried About Charity fundraiser in the Last Year:   . Arboriculturist in the Last Year:   Transportation Needs:   . Film/video editor (Medical):   Marland Kitchen Lack of Transportation (Non-Medical):   Physical Activity:   . Days of Exercise per Week:   . Minutes of Exercise per Session:   Stress:   . Feeling of Stress :   Social Connections:   . Frequency of Communication with Friends and Family:   . Frequency of Social Gatherings with Friends and Family:   . Attends Religious Services:   . Active Member of Clubs or Organizations:   . Attends Archivist Meetings:   Marland Kitchen Marital Status:      Family History:  The patient's family history includes Heart disease in his father and mother; Stroke in his mother.  ROS:   Review of Systems  Constitutional: Negative for chills, diaphoresis, fever, malaise/fatigue and weight loss.  HENT: Negative for congestion.   Eyes:  Negative for discharge and redness.  Respiratory: Negative for cough, hemoptysis, sputum production, shortness of breath and wheezing.   Cardiovascular: Negative for chest pain, palpitations, orthopnea, claudication, leg swelling and PND.  Gastrointestinal: Negative for abdominal pain, blood in stool, heartburn, melena, nausea and vomiting.  Genitourinary: Negative for hematuria.  Musculoskeletal: Negative for falls and myalgias.  Skin: Negative for rash.  Neurological: Negative for dizziness, tingling, tremors, sensory change, speech change, focal weakness, loss of consciousness and weakness.  Endo/Heme/Allergies: Does not bruise/bleed easily.  Psychiatric/Behavioral: Negative for substance abuse. The patient is not nervous/anxious.   All other systems reviewed and are negative.    EKGs/Labs/Other Studies Reviewed:    Studies reviewed were summarized above. The additional studies were reviewed today:  2D echo 07/2015: - Left ventricle:  The cavity size was normal. Systolic function was  normal. The estimated ejection fraction was in the range of 60%  to 65%. Wall motion was normal; there were no regional wall  motion abnormalities. Left ventricular diastolic function  parameters were normal.  - Mitral valve: There was mild regurgitation.  - Left atrium: The atrium was mildly dilated.  - Right ventricle: Systolic function was normal.  - Pulmonary arteries: Systolic pressure was within the normal  range. __________  [facility] 07/2015: 1. Ost 3rd Mrg to 3rd Mrg lesion, 100% stenosed - likely subacute/borderline chronic but with evidence of old clot. Post intervention with Xience DES 2.25 mm 18 mm (2.45 mm), there is a 0% residual stenosis. 2. Ramus lesion, 40% stenosed. 3. The left ventricular systolic function is normal with regional wall motion abnormality consistent with inferolateral OM branch occlusion 4. normal LVEDP   Very accurate findings on Myoview stress test single  vessel CAD in the major lateral OM3  Branch. Successful percutaneous intervention of the OM 3 with a single DES stent.  Plan:  Overnight monitoring on telemetry floor with TR band removal per protocol  Add statin  DAPT for minimum of 3 months, can then stop aspirin and continue Brilinta.  Would be acceptable to convert to Plavix after one month  Continue other risk factor modification.  The patient is a relatively labor-intensive/physical job. Nasal counseled on low stress activities with no use of the right arm/wrist over the weekend. __________  ETT 07/02/2019:  Blood pressure demonstrated a normal response to exercise.  There was no ST segment deviation noted during stress.  No T wave inversion was noted during stress.  Overall, the patient's exercise capacity was normal.  [facility] Treadmill Score: low risk   Negative stress test without evidence of ischemia at given workload.   EKG:  EKG is ordered today.  The EKG ordered today demonstrates NSR, 74 bpm, no acute ST-T changes  Recent Labs: 12/05/2018: ALT 20; BUN 13; Creatinine, Ser 1.03; Hemoglobin 14.7; Platelets 209; Potassium 4.8; Sodium 140  Recent Lipid Panel    Component Value Date/Time   CHOL 146 12/05/2018 0817   TRIG 97 12/05/2018 0817   HDL 54 12/05/2018 0817   CHOLHDL 2.8 01/11/2017 0807   VLDL 16 01/11/2017 0807   LDLCALC 74 12/05/2018 0817    PHYSICAL EXAM:    VS:  BP (!) 150/90 (BP Location: Left Arm, Patient Position: Sitting, Cuff Size: Normal)   Pulse 74   Ht 6\' 2"  (1.88 m)   Wt 252 lb (114.3 kg)   SpO2 98%   BMI 32.35 kg/m   BMI: Body mass index is 32.35 kg/m.  Physical Exam  Constitutional: He is oriented to person, place, and time. He appears well-developed and well-nourished.  HENT:  Head: Normocephalic and atraumatic.  Eyes: Right eye exhibits no discharge. Left eye exhibits no discharge.  Neck: No JVD present.  Cardiovascular: Normal rate, regular rhythm, S1 normal, S2 normal and  normal heart sounds. Exam reveals no distant heart sounds, no friction rub, no midsystolic click and no opening snap.  No murmur heard. Pulses:      Posterior tibial pulses are 2+ on the right side and 2+ on the left side.  Pulmonary/Chest: Effort normal and breath sounds normal. No respiratory distress. He has no decreased breath sounds. He has no wheezes. He has no rales. He exhibits no tenderness.  Abdominal: Soft. He exhibits no distension. There is no abdominal tenderness.  Musculoskeletal:  General: No edema.     Cervical back: Normal range of motion.  Neurological: He is alert and oriented to person, place, and time.  Skin: Skin is warm and dry. No cyanosis. Nails show no clubbing.  Psychiatric: He has a normal mood and affect. His speech is normal and behavior is normal. Judgment and thought content normal.    Wt Readings from Last 3 Encounters:  07/10/19 252 lb (114.3 kg)  05/27/19 253 lb (114.8 kg)  12/12/18 249 lb 4 oz (113.1 kg)     ASSESSMENT & PLAN:   1. CAD involving the native coronary arteries without angina: He is doing well without any symptoms concerning for angina.  Continue secondary prevention with ASA, Lipitor, carvedilol, and as needed sublingual nitroglycerin.  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  No plans for further ischemic evaluation at this time.  2. HTN: Blood pressure has been mildly elevated over the past several visits as outlined above.  Titrate carvedilol to 6.25 mg twice daily.  Low-sodium diet recommended.  3. HLD: Most recent check of 74 from 11/2018.  Continue to work on lifestyle modification.  In follow-up, if LDL remains above 70 recommend addition of Zetia.  Disposition: F/u with Dr. Saunders Revel or an APP in 6 months, sooner if needed.   Medication Adjustments/Labs and Tests Ordered: Current medicines are  reviewed at length with the patient today.  Concerns regarding medicines are outlined above. Medication changes, Labs and Tests ordered today are summarized above and listed in the Patient Instructions accessible in Encounters.   Signed, Christell Faith, PA-C 07/10/2019 9:15 AM     [address] [place], [place] [address] [phone]

## 2019-07-03 NOTE — Telephone Encounter (Signed)
Results called to pt. Pt verbalized understanding. 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

## 2019-07-10 ENCOUNTER — Ambulatory Visit (INDEPENDENT_AMBULATORY_CARE_PROVIDER_SITE_OTHER): Payer: BC Managed Care – PPO | Admitting: Physician Assistant

## 2019-07-10 ENCOUNTER — Other Ambulatory Visit: Payer: Self-pay

## 2019-07-10 ENCOUNTER — Encounter: Payer: Self-pay | Admitting: Physician Assistant

## 2019-07-10 VITALS — BP 150/90 | HR 74 | Ht 74.0 in | Wt 252.0 lb

## 2019-07-10 DIAGNOSIS — I2510 Atherosclerotic heart disease of native coronary artery without angina pectoris: Secondary | ICD-10-CM | POA: Diagnosis not present

## 2019-07-10 DIAGNOSIS — E785 Hyperlipidemia, unspecified: Secondary | ICD-10-CM | POA: Diagnosis not present

## 2019-07-10 DIAGNOSIS — I10 Essential (primary) hypertension: Secondary | ICD-10-CM | POA: Diagnosis not present

## 2019-07-10 MED ORDER — CARVEDILOL 6.25 MG PO TABS: 6.2500 mg | ORAL_TABLET | Freq: Two times a day (BID) | ORAL | 3 refills | Status: DC

## 2019-07-10 NOTE — Patient Instructions (Signed)
Medication Instructions:  1- INCREASE Coreg Take 1 tablet (6.25 mg total) by mouth 2 (two) times daily with a meal *If you need a refill on your cardiac medications before your next appointment, please call your pharmacy*   Lab Work: None ordered  If you have labs (blood work) drawn today and your tests are completely normal, you will receive your results only by: Marland Kitchen MyChart Message (if you have MyChart) OR . A paper copy in the mail If you have any lab test that is abnormal or we need to change your treatment, we will call you to review the results.   Testing/Procedures: None ordered   Follow-Up: At [facility], you and your health needs are our priority.  As part of our continuing mission to provide you with exceptional heart care, we have created designated Provider Care Teams.  These Care Teams include your primary Cardiologist (physician) and Advanced Practice Providers (APPs -  Physician Assistants and Nurse Practitioners) who all work together to provide you with the care you need, when you need it.  We recommend signing up for the patient portal called "MyChart".  Sign up information is provided on this After Visit Summary.  MyChart is used to connect with patients for Virtual Visits (Telemedicine).  Patients are able to view lab/test results, encounter notes, upcoming appointments, etc.  Non-urgent messages can be sent to your provider as well.   To learn more about what you can do with MyChart, go to [email or web address].    Your next appointment:   6 month(s)  The format for your next appointment:   In Person  Provider:    You may see Nelva Bush, MD or Christell Faith, PA-C

## 2019-10-01 ENCOUNTER — Other Ambulatory Visit: Payer: Self-pay | Admitting: Internal Medicine

## 2019-10-15 DIAGNOSIS — M542 Cervicalgia: Secondary | ICD-10-CM | POA: Diagnosis not present

## 2019-10-15 DIAGNOSIS — M5413 Radiculopathy, cervicothoracic region: Secondary | ICD-10-CM | POA: Diagnosis not present

## 2019-10-15 DIAGNOSIS — M5412 Radiculopathy, cervical region: Secondary | ICD-10-CM | POA: Diagnosis not present

## 2019-10-15 DIAGNOSIS — R202 Paresthesia of skin: Secondary | ICD-10-CM | POA: Diagnosis not present

## 2019-10-16 DIAGNOSIS — M5412 Radiculopathy, cervical region: Secondary | ICD-10-CM | POA: Diagnosis not present

## 2019-10-16 DIAGNOSIS — R202 Paresthesia of skin: Secondary | ICD-10-CM | POA: Diagnosis not present

## 2019-10-16 DIAGNOSIS — M5413 Radiculopathy, cervicothoracic region: Secondary | ICD-10-CM | POA: Diagnosis not present

## 2019-10-16 DIAGNOSIS — M542 Cervicalgia: Secondary | ICD-10-CM | POA: Diagnosis not present

## 2019-10-17 DIAGNOSIS — M5413 Radiculopathy, cervicothoracic region: Secondary | ICD-10-CM | POA: Diagnosis not present

## 2019-10-17 DIAGNOSIS — R202 Paresthesia of skin: Secondary | ICD-10-CM | POA: Diagnosis not present

## 2019-10-17 DIAGNOSIS — M5412 Radiculopathy, cervical region: Secondary | ICD-10-CM | POA: Diagnosis not present

## 2019-10-17 DIAGNOSIS — M542 Cervicalgia: Secondary | ICD-10-CM | POA: Diagnosis not present

## 2019-10-18 DIAGNOSIS — M5413 Radiculopathy, cervicothoracic region: Secondary | ICD-10-CM | POA: Diagnosis not present

## 2019-10-18 DIAGNOSIS — M5412 Radiculopathy, cervical region: Secondary | ICD-10-CM | POA: Diagnosis not present

## 2019-10-18 DIAGNOSIS — M542 Cervicalgia: Secondary | ICD-10-CM | POA: Diagnosis not present

## 2019-10-18 DIAGNOSIS — R202 Paresthesia of skin: Secondary | ICD-10-CM | POA: Diagnosis not present

## 2019-10-21 DIAGNOSIS — M5412 Radiculopathy, cervical region: Secondary | ICD-10-CM | POA: Diagnosis not present

## 2019-10-21 DIAGNOSIS — M5413 Radiculopathy, cervicothoracic region: Secondary | ICD-10-CM | POA: Diagnosis not present

## 2019-10-21 DIAGNOSIS — M542 Cervicalgia: Secondary | ICD-10-CM | POA: Diagnosis not present

## 2019-10-21 DIAGNOSIS — R202 Paresthesia of skin: Secondary | ICD-10-CM | POA: Diagnosis not present

## 2019-10-23 DIAGNOSIS — R202 Paresthesia of skin: Secondary | ICD-10-CM | POA: Diagnosis not present

## 2019-10-23 DIAGNOSIS — M5412 Radiculopathy, cervical region: Secondary | ICD-10-CM | POA: Diagnosis not present

## 2019-10-23 DIAGNOSIS — M542 Cervicalgia: Secondary | ICD-10-CM | POA: Diagnosis not present

## 2019-10-23 DIAGNOSIS — M5413 Radiculopathy, cervicothoracic region: Secondary | ICD-10-CM | POA: Diagnosis not present

## 2019-10-24 DIAGNOSIS — M542 Cervicalgia: Secondary | ICD-10-CM | POA: Diagnosis not present

## 2019-10-24 DIAGNOSIS — R202 Paresthesia of skin: Secondary | ICD-10-CM | POA: Diagnosis not present

## 2019-10-24 DIAGNOSIS — M5413 Radiculopathy, cervicothoracic region: Secondary | ICD-10-CM | POA: Diagnosis not present

## 2019-10-24 DIAGNOSIS — M5412 Radiculopathy, cervical region: Secondary | ICD-10-CM | POA: Diagnosis not present

## 2019-10-29 DIAGNOSIS — R202 Paresthesia of skin: Secondary | ICD-10-CM | POA: Diagnosis not present

## 2019-10-29 DIAGNOSIS — M5412 Radiculopathy, cervical region: Secondary | ICD-10-CM | POA: Diagnosis not present

## 2019-10-29 DIAGNOSIS — M542 Cervicalgia: Secondary | ICD-10-CM | POA: Diagnosis not present

## 2019-10-29 DIAGNOSIS — M5413 Radiculopathy, cervicothoracic region: Secondary | ICD-10-CM | POA: Diagnosis not present

## 2019-10-30 ENCOUNTER — Ambulatory Visit: Payer: Self-pay

## 2019-10-30 NOTE — Telephone Encounter (Signed)
Patient called stating that he has had pain to his left arm and neck for 3 weeks. He has been evaluated by chiropractor. He states he has not solved his problem. He states that his arm feels like pins and needles from his elbow to his little finger. He rates pain 5-6. He has no chest pain, no SOB, no sweats. His pain is constant and gets worse with movement of his head and arm. He has not injured himself. He can grip with his left hand. Per protocol patient will keep appointment scheduled  Tomorrow.  Care advice read to patient. He verbalized understanding  Of all instructions.  Reason for Disposition . Numbness (i.e., loss of sensation) in hand or fingers  Answer Assessment - Initial Assessment Questions 1. ONSET: "When did the pain start?"     3 weeks ago 2. LOCATION: "Where is the pain located?"     Left side neck arm 3. PAIN: "How bad is the pain?" (Scale 1-10; or mild, moderate, severe)   - MILD (1-3): doesn't interfere with normal activities   - MODERATE (4-7): interferes with normal activities (e.g., work or school) or awakens from sleep   - SEVERE (8-10): excruciating pain, unable to do any normal activities, unable to hold a cup of water     5-6 4. WORK OR EXERCISE: "Has there been any recent work or exercise that involved this part of the body?"    no 5. CAUSE: "What do you think is causing the arm pain?"     Unsure but ciropractor states veritbra involved 6. OTHER SYMPTOMS: "Do you have any other symptoms?" (e.g., neck pain, swelling, rash, fever, numbness, weakness)     Neck pain, little finger to elbow numbness 7. PREGNANCY: "Is there any chance you are pregnant?" "When was your last menstrual period?"     N/A  Protocols used: ARM PAIN-A-AH

## 2019-10-31 ENCOUNTER — Ambulatory Visit (INDEPENDENT_AMBULATORY_CARE_PROVIDER_SITE_OTHER): Payer: BC Managed Care – PPO | Admitting: Family Medicine

## 2019-10-31 ENCOUNTER — Encounter: Payer: Self-pay | Admitting: Family Medicine

## 2019-10-31 ENCOUNTER — Other Ambulatory Visit: Payer: Self-pay

## 2019-10-31 VITALS — BP 144/90 | HR 68 | Ht 74.0 in | Wt 239.0 lb

## 2019-10-31 DIAGNOSIS — G5622 Lesion of ulnar nerve, left upper limb: Secondary | ICD-10-CM

## 2019-10-31 DIAGNOSIS — Z23 Encounter for immunization: Secondary | ICD-10-CM | POA: Diagnosis not present

## 2019-10-31 DIAGNOSIS — I10 Essential (primary) hypertension: Secondary | ICD-10-CM | POA: Diagnosis not present

## 2019-10-31 DIAGNOSIS — Z6829 Body mass index (BMI) 29.0-29.9, adult: Secondary | ICD-10-CM

## 2019-10-31 DIAGNOSIS — M5010 Cervical disc disorder with radiculopathy, unspecified cervical region: Secondary | ICD-10-CM | POA: Diagnosis not present

## 2019-10-31 MED ORDER — MELOXICAM 15 MG PO TABS: 15.0000 mg | ORAL_TABLET | Freq: Every day | ORAL | 0 refills | Status: DC

## 2019-10-31 MED ORDER — PREDNISONE 10 MG PO TABS: 10.0000 mg | ORAL_TABLET | Freq: Every day | ORAL | 0 refills | Status: DC

## 2019-10-31 MED ORDER — CYCLOBENZAPRINE HCL 10 MG PO TABS: 10.0000 mg | ORAL_TABLET | Freq: Every day | ORAL | 0 refills | Status: DC

## 2019-10-31 NOTE — Telephone Encounter (Signed)
Pt was seen today.

## 2019-10-31 NOTE — Progress Notes (Signed)
Date:  10/31/2019   Name:  Rodney Briggs   DOB:  1965/02/24   MRN:  [id number]   Chief Complaint: Arm Pain (turn head and pain goes down L) arm- 3,4, and 5 digits are numb.) and Flu Vaccine  Neck Pain  This is a chronic (cervical radiculopahty) problem. The current episode started more than 1 year ago. The problem has been unchanged. Associated with: rotation of neck. The pain is present in the left side. The quality of the pain is described as aching. The symptoms are aggravated by twisting (extension). Associated symptoms include numbness, paresis and weakness. Pertinent negatives include no chest pain, fever, headaches, leg pain, pain with swallowing, photophobia, syncope, tingling, trouble swallowing, visual change or weight loss. He has tried Art therapist (chiropractor) for the symptoms. The treatment provided no relief.    Lab Results  Component Value Date   CREATININE 1.03 12/05/2018   BUN 13 12/05/2018   NA 140 12/05/2018   K 4.8 12/05/2018   CL 105 12/05/2018   CO2 23 12/05/2018   Lab Results  Component Value Date   CHOL 146 12/05/2018   HDL 54 12/05/2018   LDLCALC 74 12/05/2018   TRIG 97 12/05/2018   CHOLHDL 2.8 01/11/2017   No results found for: TSH No results found for: HGBA1C Lab Results  Component Value Date   WBC 7.1 12/05/2018   HGB 14.7 12/05/2018   HCT 43.3 12/05/2018   MCV 88 12/05/2018   PLT 209 12/05/2018   Lab Results  Component Value Date   ALT 20 12/05/2018   AST 29 12/05/2018   ALKPHOS 77 12/05/2018   BILITOT 0.6 12/05/2018     Review of Systems  Constitutional: Negative for chills, fever and weight loss.  HENT: Negative for drooling, ear discharge, ear pain, sore throat and trouble swallowing.   Eyes: Negative for photophobia.  Respiratory: Negative for cough, shortness of breath and wheezing.   Cardiovascular: Negative for chest pain, palpitations, leg swelling and syncope.  Gastrointestinal: Negative for abdominal pain,  blood in stool, constipation, diarrhea and nausea.  Endocrine: Negative for polydipsia.  Genitourinary: Negative for dysuria, frequency, hematuria and urgency.  Musculoskeletal: Positive for neck pain. Negative for back pain and myalgias.  Skin: Negative for rash.  Allergic/Immunologic: Negative for environmental allergies.  Neurological: Positive for weakness and numbness. Negative for dizziness, tingling and headaches.  Hematological: Does not bruise/bleed easily.  Psychiatric/Behavioral: Negative for suicidal ideas. The patient is not nervous/anxious.     Patient Active Problem List   Diagnosis Date Noted  . Elevated blood pressure reading 12/13/2018  . Pain in both upper extremities 06/01/2018  . Hyperlipidemia LDL goal <70 01/18/2017  . Abnormal nuclear stress test 08/19/2015  . CAD S/P percutaneous coronary angioplasty 08/19/2015  . Mild mitral regurgitation     No Known Allergies  Past Surgical History:  Procedure Laterality Date  . CARDIAC CATHETERIZATION Left 08/19/2015   Procedure: Left Heart Cath and Coronary Angiography;  Surgeon: Leonie Man, MD;  Location: [facility];  Service: Cardiovascular;  Laterality: Left;  . CARDIAC CATHETERIZATION N/A 08/19/2015   Procedure: Coronary Stent Intervention;  Surgeon: Leonie Man, MD;  Location: [facility];  Service: Cardiovascular;  Laterality: N/A;    Social History   Tobacco Use  . Smoking status: Never Smoker  . Smokeless tobacco: Never Used  Substance Use Topics  . Alcohol use: Yes    Alcohol/week: 0.0 standard drinks  . Drug use: No  Medication list has been reviewed and updated.  Current Meds  Medication Sig  . atorvastatin (LIPITOR) 80 MG tablet TAKE 1 TABLET BY MOUTH DAILY AT 6PM  . carvedilol (COREG) 6.25 MG tablet Take 1 tablet (6.25 mg total) by mouth 2 (two) times daily with a meal.  . [facility] ASPIRIN ADULT LOW DOSE 81 MG chewable tablet CHEW 1 TABLET BY MOUTH EVERY DAY  .  indomethacin (INDOCIN) 25 MG capsule TAKE 1 CAPSULE (25 MG TOTAL) BY MOUTH 2 (TWO) TIMES DAILY WITH A MEAL.  . nitroGLYCERIN (NITROSTAT) 0.4 MG SL tablet Place 1 tablet (0.4 mg total) under the tongue every 5 (five) minutes as needed for chest pain.  Marland Kitchen omeprazole (PRILOSEC) 40 MG capsule TAKE 1 CAPSULE BY MOUTH EVERY DAY    PHQ 2/9 Scores 10/31/2019 05/27/2019 04/19/2018 10/19/2017  PHQ - 2 Score 0 0 0 0  PHQ- 9 Score 3 0 0 0    GAD 7 : Generalized Anxiety Score 10/31/2019 05/27/2019  Nervous, Anxious, on Edge 0 0  Control/stop worrying 0 0  Worry too much - different things 0 0  Trouble relaxing 0 0  Restless 0 0  Easily annoyed or irritable 0 0  Afraid - awful might happen 0 0  Total GAD 7 Score 0 0    BP Readings from Last 3 Encounters:  10/31/19 (!) 144/90  07/10/19 (!) 150/90  05/27/19 132/80    Physical Exam Vitals and nursing note reviewed.  HENT:     Head: Normocephalic.     Right Ear: External ear normal.     Left Ear: External ear normal.     Nose: Nose normal.  Eyes:     General: No scleral icterus.       Right eye: No discharge.        Left eye: No discharge.     Conjunctiva/sclera: Conjunctivae normal.     Pupils: Pupils are equal, round, and reactive to light.  Neck:     Thyroid: No thyromegaly.     Vascular: No JVD.     Trachea: No tracheal deviation.  Cardiovascular:     Rate and Rhythm: Normal rate and regular rhythm.     Heart sounds: Normal heart sounds. No murmur heard.  No friction rub. No gallop.   Pulmonary:     Effort: No respiratory distress.     Breath sounds: Normal breath sounds. No wheezing or rales.  Abdominal:     General: Bowel sounds are normal.     Palpations: Abdomen is soft. There is no mass.     Tenderness: There is no abdominal tenderness. There is no guarding or rebound.  Musculoskeletal:        General: No tenderness. Normal range of motion.     Cervical back: Normal range of motion and neck supple.  Lymphadenopathy:      Cervical: No cervical adenopathy.  Skin:    General: Skin is warm.     Findings: No rash.  Neurological:     Mental Status: He is alert and oriented to person, place, and time.     Cranial Nerves: No cranial nerve deficit.     Sensory: Sensory deficit present.     Deep Tendon Reflexes: Reflexes are normal and symmetric.     Comments: Decreased sensory left ulna nerve     Wt Readings from Last 3 Encounters:  10/31/19 239 lb (108.4 kg)  07/10/19 252 lb (114.3 kg)  05/27/19 253 lb (114.8 kg)    BP Marland Kitchen)  144/90   Pulse 68   Ht 6\' 2"  (1.88 m)   Wt 239 lb (108.4 kg)   BMI 30.69 kg/m   Assessment and Plan: 1. Cervical disc disorder with radiculopathy Chronic.  Persistent.  Relatively stable but uncomfortable.  Resume meloxicam 15 mg once a day and prednisone taper 40 mg over 2 weeks.  Patient also was given muscle relaxants for sleep aid.  Will refer to orthopedics type Monday for return to orthopedic care to direct further evaluation and treatment of suspected cervical disc. - meloxicam (MOBIC) 15 MG tablet; Take 1 tablet (15 mg total) by mouth daily.  Dispense: 30 tablet; Refill: 0 - predniSONE (DELTASONE) 10 MG tablet; Take 1 tablet (10 mg total) by mouth daily with breakfast. Taper 4,4,4,3,3,3,2,2,2,1,1,1  Dispense: 30 tablet; Refill: 0 - cyclobenzaprine (FLEXERIL) 10 MG tablet; Take 1 tablet (10 mg total) by mouth at bedtime.  Dispense: 30 tablet; Refill: 0 - Ambulatory referral to Orthopedic Surgery  2. Ulnar nerve impingement, left Chronic.  Controlled.  Stable.  As seen above patient is resumed on meloxicam, prednisone, and cyclobenzaprine.  Patient has radiculopathy when he rotates his neck to the right or with extension or with flexion to the right. - meloxicam (MOBIC) 15 MG tablet; Take 1 tablet (15 mg total) by mouth daily.  Dispense: 30 tablet; Refill: 0 - predniSONE (DELTASONE) 10 MG tablet; Take 1 tablet (10 mg total) by mouth daily with breakfast. Taper  4,4,4,3,3,3,2,2,2,1,1,1  Dispense: 30 tablet; Refill: 0 - cyclobenzaprine (FLEXERIL) 10 MG tablet; Take 1 tablet (10 mg total) by mouth at bedtime.  Dispense: 30 tablet; Refill: 0 - Ambulatory referral to Orthopedic Surgery  3. Need for immunization against influenza Discussed and administered. - Flu Vaccine QUAD 36+ mos IM  4. Essential hypertension Chronic.  Controlled.  Stable.  Patient's recently been increased on Coreg.  Blood pressure remains 144/90 we will recheck.  In the meantime patient has been given a Dash diet to use for weight control as well as blood pressure control.  5. BMI 29.0-29.9,adult Health risks of being over weight were discussed and patient was counseled on weight loss options and exercise.Discussed and administered Dash diet for control of blood pressure.

## 2019-10-31 NOTE — Patient Instructions (Signed)
DASH Eating Plan DASH stands for "Dietary Approaches to Stop Hypertension." The DASH eating plan is a healthy eating plan that has been shown to reduce high blood pressure (hypertension). It may also reduce your risk for type 2 diabetes, heart disease, and stroke. The DASH eating plan may also help with weight loss. What are tips for following this plan?  General guidelines  Avoid eating more than 2,300 mg (milligrams) of salt (sodium) a day. If you have hypertension, you may need to reduce your sodium intake to 1,500 mg a day.  Limit alcohol intake to no more than 1 drink a day for nonpregnant women and 2 drinks a day for men. One drink equals 12 oz of beer, 5 oz of wine, or 1 oz of hard liquor.  Work with your health care provider to maintain a healthy body weight or to lose weight. Ask what an ideal weight is for you.  Get at least 30 minutes of exercise that causes your heart to beat faster (aerobic exercise) most days of the week. Activities may include walking, swimming, or biking.  Work with your health care provider or diet and nutrition specialist (dietitian) to adjust your eating plan to your individual calorie needs. Reading food labels   Check food labels for the amount of sodium per serving. Choose foods with less than 5 percent of the Daily Value of sodium. Generally, foods with less than 300 mg of sodium per serving fit into this eating plan.  To find whole grains, look for the word "whole" as the first word in the ingredient list. Shopping  Buy products labeled as "low-sodium" or "no salt added."  Buy fresh foods. Avoid canned foods and premade or frozen meals. Cooking  Avoid adding salt when cooking. Use salt-free seasonings or herbs instead of table salt or sea salt. Check with your health care provider or pharmacist before using salt substitutes.  Do not fry foods. Cook foods using healthy methods such as baking, boiling, grilling, and broiling instead.  Cook with  heart-healthy oils, such as olive, canola, soybean, or sunflower oil. Meal planning  Eat a balanced diet that includes: ? 5 or more servings of fruits and vegetables each day. At each meal, try to fill half of your plate with fruits and vegetables. ? Up to 6-8 servings of whole grains each day. ? Less than 6 oz of lean meat, poultry, or fish each day. A 3-oz serving of meat is about the same size as a deck of cards. One egg equals 1 oz. ? 2 servings of low-fat dairy each day. ? A serving of nuts, seeds, or beans 5 times each week. ? Heart-healthy fats. Healthy fats called Omega-3 fatty acids are found in foods such as flaxseeds and coldwater fish, like sardines, salmon, and mackerel.  Limit how much you eat of the following: ? Canned or prepackaged foods. ? Food that is high in trans fat, such as fried foods. ? Food that is high in saturated fat, such as fatty meat. ? Sweets, desserts, sugary drinks, and other foods with added sugar. ? Full-fat dairy products.  Do not salt foods before eating.  Try to eat at least 2 vegetarian meals each week.  Eat more home-cooked food and less restaurant, buffet, and fast food.  When eating at a restaurant, ask that your food be prepared with less salt or no salt, if possible. What foods are recommended? The items listed may not be a complete list. Talk with your dietitian about   what dietary choices are best for you. Grains Whole-grain or whole-wheat bread. Whole-grain or whole-wheat pasta. Brown rice. Oatmeal. Quinoa. Bulgur. Whole-grain and low-sodium cereals. Pita bread. Low-fat, low-sodium crackers. Whole-wheat flour tortillas. Vegetables Fresh or frozen vegetables (raw, steamed, roasted, or grilled). Low-sodium or reduced-sodium tomato and vegetable juice. Low-sodium or reduced-sodium tomato sauce and tomato paste. Low-sodium or reduced-sodium canned vegetables. Fruits All fresh, dried, or frozen fruit. Canned fruit in natural juice (without  added sugar). Meat and other protein foods Skinless chicken or turkey. Ground chicken or turkey. Pork with fat trimmed off. Fish and seafood. Egg whites. Dried beans, peas, or lentils. Unsalted nuts, nut butters, and seeds. Unsalted canned beans. Lean cuts of beef with fat trimmed off. Low-sodium, lean deli meat. Dairy Low-fat (1%) or fat-free (skim) milk. Fat-free, low-fat, or reduced-fat cheeses. Nonfat, low-sodium ricotta or cottage cheese. Low-fat or nonfat yogurt. Low-fat, low-sodium cheese. Fats and oils Soft margarine without trans fats. Vegetable oil. Low-fat, reduced-fat, or light mayonnaise and salad dressings (reduced-sodium). Canola, safflower, olive, soybean, and sunflower oils. Avocado. Seasoning and other foods Herbs. Spices. Seasoning mixes without salt. Unsalted popcorn and pretzels. Fat-free sweets. What foods are not recommended? The items listed may not be a complete list. Talk with your dietitian about what dietary choices are best for you. Grains Baked goods made with fat, such as croissants, muffins, or some breads. Dry pasta or rice meal packs. Vegetables Creamed or fried vegetables. Vegetables in a cheese sauce. Regular canned vegetables (not low-sodium or reduced-sodium). Regular canned tomato sauce and paste (not low-sodium or reduced-sodium). Regular tomato and vegetable juice (not low-sodium or reduced-sodium). Pickles. Olives. Fruits Canned fruit in a light or heavy syrup. Fried fruit. Fruit in cream or butter sauce. Meat and other protein foods Fatty cuts of meat. Ribs. Fried meat. Bacon. Sausage. Bologna and other processed lunch meats. Salami. Fatback. Hotdogs. Bratwurst. Salted nuts and seeds. Canned beans with added salt. Canned or smoked fish. Whole eggs or egg yolks. Chicken or turkey with skin. Dairy Whole or 2% milk, cream, and half-and-half. Whole or full-fat cream cheese. Whole-fat or sweetened yogurt. Full-fat cheese. Nondairy creamers. Whipped toppings.  Processed cheese and cheese spreads. Fats and oils Butter. Stick margarine. Lard. Shortening. Ghee. Bacon fat. Tropical oils, such as coconut, palm kernel, or palm oil. Seasoning and other foods Salted popcorn and pretzels. Onion salt, garlic salt, seasoned salt, table salt, and sea salt. Worcestershire sauce. Tartar sauce. Barbecue sauce. Teriyaki sauce. Soy sauce, including reduced-sodium. Steak sauce. Canned and packaged gravies. Fish sauce. Oyster sauce. Cocktail sauce. Horseradish that you find on the shelf. Ketchup. Mustard. Meat flavorings and tenderizers. Bouillon cubes. Hot sauce and Tabasco sauce. Premade or packaged marinades. Premade or packaged taco seasonings. Relishes. Regular salad dressings. Where to find more information:  National Heart, Lung, and Blood Institute: www.nhlbi.nih.gov  American Heart Association: www.heart.org Summary  The DASH eating plan is a healthy eating plan that has been shown to reduce high blood pressure (hypertension). It may also reduce your risk for type 2 diabetes, heart disease, and stroke.  With the DASH eating plan, you should limit salt (sodium) intake to 2,300 mg a day. If you have hypertension, you may need to reduce your sodium intake to 1,500 mg a day.  When on the DASH eating plan, aim to eat more fresh fruits and vegetables, whole grains, lean proteins, low-fat dairy, and heart-healthy fats.  Work with your health care provider or diet and nutrition specialist (dietitian) to adjust your eating plan to your   individual calorie needs. This information is not intended to replace advice given to you by your health care provider. Make sure you discuss any questions you have with your health care provider. Document Revised: 01/20/2017 Document Reviewed: 02/01/2016 Elsevier Patient Education  2020 [employer]  

## 2019-11-19 DIAGNOSIS — G5622 Lesion of ulnar nerve, left upper limb: Secondary | ICD-10-CM | POA: Diagnosis not present

## 2019-11-19 DIAGNOSIS — M5412 Radiculopathy, cervical region: Secondary | ICD-10-CM | POA: Diagnosis not present

## 2019-11-23 ENCOUNTER — Other Ambulatory Visit: Payer: Self-pay | Admitting: Family Medicine

## 2019-11-23 DIAGNOSIS — M5010 Cervical disc disorder with radiculopathy, unspecified cervical region: Secondary | ICD-10-CM

## 2019-11-23 DIAGNOSIS — G5622 Lesion of ulnar nerve, left upper limb: Secondary | ICD-10-CM

## 2019-11-23 NOTE — Telephone Encounter (Signed)
Requested  medications are  due for refill today yes  Requested medications are on the active medication list yes  Last refill 9/9  Last visit 9/9  Saw Ortho 9/28  Notes to clinic Unsure if ortho note meant he was getting rx from Ortho at [facility] or to refill current rx from Dr. Ronnald Ramp.

## 2019-12-03 ENCOUNTER — Ambulatory Visit: Payer: BC Managed Care – PPO | Admitting: Family Medicine

## 2019-12-03 ENCOUNTER — Encounter: Payer: Self-pay | Admitting: Family Medicine

## 2019-12-03 ENCOUNTER — Other Ambulatory Visit: Payer: Self-pay

## 2019-12-03 VITALS — BP 136/78 | HR 64 | Ht 74.0 in | Wt 246.0 lb

## 2019-12-03 DIAGNOSIS — I10 Essential (primary) hypertension: Secondary | ICD-10-CM | POA: Diagnosis not present

## 2019-12-03 DIAGNOSIS — E785 Hyperlipidemia, unspecified: Secondary | ICD-10-CM

## 2019-12-03 DIAGNOSIS — M109 Gout, unspecified: Secondary | ICD-10-CM | POA: Diagnosis not present

## 2019-12-03 DIAGNOSIS — K219 Gastro-esophageal reflux disease without esophagitis: Secondary | ICD-10-CM | POA: Diagnosis not present

## 2019-12-03 MED ORDER — OMEPRAZOLE 40 MG PO CPDR: DELAYED_RELEASE_CAPSULE | ORAL | 2 refills | Status: DC

## 2019-12-03 MED ORDER — CARVEDILOL 6.25 MG PO TABS: 6.2500 mg | ORAL_TABLET | Freq: Two times a day (BID) | ORAL | 1 refills | Status: DC

## 2019-12-03 MED ORDER — ATORVASTATIN CALCIUM 80 MG PO TABS: ORAL_TABLET | ORAL | 1 refills | Status: DC

## 2019-12-03 NOTE — Progress Notes (Signed)
Date:  12/03/2019   Name:  Rodney Briggs   DOB:  [birth year]   MRN:  [id number]   Chief Complaint: Gout and Gastroesophageal Reflux  Gastroesophageal Reflux He reports no abdominal pain, no belching, no chest pain, no choking, no coughing, no dysphagia, no early satiety, no globus sensation, no heartburn, no hoarse voice, no nausea, no sore throat, no stridor, no tooth decay, no water brash or no wheezing. This is a chronic problem. The current episode started more than 1 year ago. The problem occurs rarely. The problem has been gradually improving. Pertinent negatives include no anemia, fatigue, melena, muscle weakness, orthopnea or weight loss. He has tried a PPI for the symptoms. The treatment provided mild relief.  Hypertension This is a chronic problem. The current episode started more than 1 year ago. The problem has been gradually improving since onset. The problem is controlled. Pertinent negatives include no chest pain, headaches, malaise/fatigue, neck pain, palpitations, peripheral edema, PND, shortness of breath or sweats. Risk factors for coronary artery disease include dyslipidemia. Past treatments include beta blockers and alpha 1 blockers. The current treatment provides moderate improvement. There are no compliance problems.   Hyperlipidemia This is a chronic problem. The current episode started more than 1 year ago. The problem is controlled. Pertinent negatives include no chest pain, myalgias or shortness of breath. Current antihyperlipidemic treatment includes statins.    Lab Results  Component Value Date   CREATININE 1.03 12/05/2018   BUN 13 12/05/2018   NA 140 12/05/2018   K 4.8 12/05/2018   CL 105 12/05/2018   CO2 23 12/05/2018   Lab Results  Component Value Date   CHOL 146 12/05/2018   HDL 54 12/05/2018   LDLCALC 74 12/05/2018   TRIG 97 12/05/2018   CHOLHDL 2.8 01/11/2017   No results found for: TSH No results found for: HGBA1C Lab Results  Component Value  Date   WBC 7.1 12/05/2018   HGB 14.7 12/05/2018   HCT 43.3 12/05/2018   MCV 88 12/05/2018   PLT 209 12/05/2018   Lab Results  Component Value Date   ALT 20 12/05/2018   AST 29 12/05/2018   ALKPHOS 77 12/05/2018   BILITOT 0.6 12/05/2018     Review of Systems  Constitutional: Negative for chills, fatigue, fever, malaise/fatigue and weight loss.  HENT: Negative for drooling, ear discharge, ear pain, hoarse voice and sore throat.   Respiratory: Negative for cough, choking, shortness of breath and wheezing.   Cardiovascular: Negative for chest pain, palpitations, leg swelling and PND.  Gastrointestinal: Negative for abdominal pain, blood in stool, constipation, diarrhea, dysphagia, heartburn, melena and nausea.  Endocrine: Negative for polydipsia.  Genitourinary: Negative for dysuria, frequency, hematuria and urgency.  Musculoskeletal: Negative for back pain, myalgias, muscle weakness and neck pain.  Skin: Negative for rash.  Allergic/Immunologic: Negative for environmental allergies.  Neurological: Negative for dizziness and headaches.  Hematological: Does not bruise/bleed easily.  Psychiatric/Behavioral: Negative for suicidal ideas. The patient is not nervous/anxious.     Patient Active Problem List   Diagnosis Date Noted  . Elevated blood pressure reading 12/13/2018  . Pain in both upper extremities 06/01/2018  . Hyperlipidemia LDL goal <70 01/18/2017  . Abnormal nuclear stress test 08/19/2015  . CAD S/P percutaneous coronary angioplasty 08/19/2015  . Mild mitral regurgitation     No Known Allergies  Past Surgical History:  Procedure Laterality Date  . CARDIAC CATHETERIZATION Left 08/19/2015   Procedure: Left Heart Cath and Coronary Angiography;  Surgeon: Leonie Man, MD;  Location: [facility];  Service: Cardiovascular;  Laterality: Left;  . CARDIAC CATHETERIZATION N/A 08/19/2015   Procedure: Coronary Stent Intervention;  Surgeon: Leonie Man, MD;   Location: [facility];  Service: Cardiovascular;  Laterality: N/A;    Social History   Tobacco Use  . Smoking status: Never Smoker  . Smokeless tobacco: Never Used  Substance Use Topics  . Alcohol use: Yes    Alcohol/week: 0.0 standard drinks  . Drug use: No     Medication list has been reviewed and updated.  Current Meds  Medication Sig  . atorvastatin (LIPITOR) 80 MG tablet TAKE 1 TABLET BY MOUTH DAILY AT 6PM  . carvedilol (COREG) 6.25 MG tablet Take 1 tablet (6.25 mg total) by mouth 2 (two) times daily with a meal.  . [facility] ASPIRIN ADULT LOW DOSE 81 MG chewable tablet CHEW 1 TABLET BY MOUTH EVERY DAY  . indomethacin (INDOCIN) 25 MG capsule TAKE 1 CAPSULE (25 MG TOTAL) BY MOUTH 2 (TWO) TIMES DAILY WITH A MEAL.  . nitroGLYCERIN (NITROSTAT) 0.4 MG SL tablet Place 1 tablet (0.4 mg total) under the tongue every 5 (five) minutes as needed for chest pain.  Marland Kitchen omeprazole (PRILOSEC) 40 MG capsule TAKE 1 CAPSULE BY MOUTH EVERY DAY    PHQ 2/9 Scores 12/03/2019 10/31/2019 05/27/2019 04/19/2018  PHQ - 2 Score 0 0 0 0  PHQ- 9 Score 0 3 0 0    GAD 7 : Generalized Anxiety Score 12/03/2019 10/31/2019 05/27/2019  Nervous, Anxious, on Edge 0 0 0  Control/stop worrying 0 0 0  Worry too much - different things 0 0 0  Trouble relaxing 0 0 0  Restless 0 0 0  Easily annoyed or irritable 0 0 0  Afraid - awful might happen 0 0 0  Total GAD 7 Score 0 0 0    BP Readings from Last 3 Encounters:  12/03/19 136/78  10/31/19 (!) 144/90  07/10/19 (!) 150/90    Physical Exam Vitals and nursing note reviewed.  HENT:     Head: Normocephalic.     Right Ear: Tympanic membrane, ear canal and external ear normal.     Left Ear: Tympanic membrane, ear canal and external ear normal.     Nose: Nose normal.  Eyes:     General: No scleral icterus.       Right eye: No discharge.        Left eye: No discharge.     Conjunctiva/sclera: Conjunctivae normal.     Pupils: Pupils are equal, round, and reactive  to light.  Neck:     Thyroid: No thyromegaly.     Vascular: No JVD.     Trachea: No tracheal deviation.  Cardiovascular:     Rate and Rhythm: Normal rate and regular rhythm.     Pulses: Normal pulses.     Heart sounds: Normal heart sounds, S1 normal and S2 normal. No murmur heard.  No systolic murmur is present.  No diastolic murmur is present.  No friction rub. No gallop. No S3 or S4 sounds.   Pulmonary:     Effort: No respiratory distress.     Breath sounds: Normal breath sounds. No wheezing or rales.  Abdominal:     General: Bowel sounds are normal.     Palpations: Abdomen is soft. There is no mass.     Tenderness: There is no abdominal tenderness. There is no guarding or rebound.  Musculoskeletal:  General: No tenderness. Normal range of motion.     Cervical back: Normal range of motion and neck supple.     Right lower leg: No edema.     Left lower leg: No edema.  Lymphadenopathy:     Cervical: No cervical adenopathy.  Skin:    General: Skin is warm.     Findings: No rash.  Neurological:     Mental Status: He is alert and oriented to person, place, and time.     Cranial Nerves: No cranial nerve deficit.     Deep Tendon Reflexes: Reflexes are normal and symmetric.     Wt Readings from Last 3 Encounters:  12/03/19 246 lb (111.6 kg)  10/31/19 239 lb (108.4 kg)  07/10/19 252 lb (114.3 kg)    BP 136/78   Pulse 64   Ht 6\' 2"  (1.88 m)   Wt 246 lb (111.6 kg)   BMI 31.58 kg/m   Assessment and Plan: 1. Gastroesophageal reflux disease, unspecified whether esophagitis present Chronic. Controlled. Stable. Patient is doing extremely well with no breakthrough reflux. Will continue omeprazole 40 mg once a day. - omeprazole (PRILOSEC) 40 MG capsule; TAKE 1 CAPSULE BY MOUTH EVERY DAY  Dispense: 90 capsule; Refill: 2  2. Gout, unspecified cause, unspecified chronicity, unspecified site Episodic. Chronic. Currently stable. Patient has no needs for refills with plenty of  indomethacin at this time. Patient only uses on a as needed basis for an acute attack.  3. Essential hypertension Chronic. Controlled. Stable. Patient has a history of an abnormal stress test suggesting coronary artery disease followed by Dr. Saunders Revel. Patient has an appointment November 24 to see Dr. Saunders Revel at which time this will be evaluated with labs that we will draw and whether to continue on present course of therapy. - Comprehensive Metabolic Panel (CMET) - carvedilol (COREG) 6.25 MG tablet; Take 1 tablet (6.25 mg total) by mouth 2 (two) times daily with a meal.  Dispense: 180 tablet; Refill: 1  4. Hyperlipidemia LDL goal <70 This is also followed by Dr. Saunders Revel. We will refill medication as that he is about to run out and will check lipid panel since patient's only had special cadence milk. Patient as noted above has an appointment with cardiology. - Lipid Panel With LDL/HDL Ratio - atorvastatin (LIPITOR) 80 MG tablet; One a day  Dispense: 90 tablet; Refill: 1

## 2019-12-04 LAB — LIPID PANEL WITH LDL/HDL RATIO
Cholesterol, Total: 171 mg/dL (ref 100–199)
HDL: 51 mg/dL (ref >39)
LDL Chol Calc (NIH): 85 mg/dL (ref 0–99)
LDL/HDL Ratio: 1.7 ratio (ref 0.0–3.6)
Triglycerides: 212 mg/dL — ABNORMAL HIGH (ref 0–149)
VLDL Cholesterol Cal: 35 mg/dL (ref 5–40)

## 2019-12-04 LAB — COMPREHENSIVE METABOLIC PANEL
ALT: 17 IU/L (ref 0–44)
AST: 14 IU/L (ref 0–40)
Albumin/Globulin Ratio: 2 (ref 1.2–2.2)
Albumin: 4.3 g/dL (ref 3.8–4.9)
Alkaline Phosphatase: 69 IU/L (ref 44–121)
BUN/Creatinine Ratio: 14 (ref 9–20)
BUN: 15 mg/dL (ref 6–24)
Bilirubin Total: 0.4 mg/dL (ref 0.0–1.2)
CO2: 21 mmol/L (ref 20–29)
Calcium: 9.3 mg/dL (ref 8.7–10.2)
Chloride: 104 mmol/L (ref 96–106)
Creatinine, Ser: 1.05 mg/dL (ref 0.76–1.27)
GFR calc Af Amer: 93 mL/min/{1.73_m2} (ref >59)
GFR calc non Af Amer: 80 mL/min/{1.73_m2} (ref >59)
Globulin, Total: 2.1 g/dL (ref 1.5–4.5)
Glucose: 137 mg/dL — ABNORMAL HIGH (ref 65–99)
Potassium: 4.2 mmol/L (ref 3.5–5.2)
Sodium: 139 mmol/L (ref 134–144)
Total Protein: 6.4 g/dL (ref 6.0–8.5)

## 2019-12-13 ENCOUNTER — Other Ambulatory Visit: Payer: BC Managed Care – PPO

## 2019-12-13 DIAGNOSIS — R7309 Other abnormal glucose: Secondary | ICD-10-CM | POA: Diagnosis not present

## 2019-12-14 LAB — GLUCOSE, RANDOM: Glucose: 87 mg/dL (ref 65–99)

## 2019-12-14 LAB — HEMOGLOBIN A1C
Est. average glucose Bld gHb Est-mCnc: 114 mg/dL
Hgb A1c MFr Bld: 5.6 % (ref 4.8–5.6)

## 2020-01-15 ENCOUNTER — Ambulatory Visit: Payer: BC Managed Care – PPO | Admitting: Nurse Practitioner

## 2020-01-15 ENCOUNTER — Other Ambulatory Visit: Payer: Self-pay

## 2020-01-15 ENCOUNTER — Encounter: Payer: Self-pay | Admitting: Nurse Practitioner

## 2020-01-15 VITALS — BP 130/80 | HR 62 | Ht 74.0 in | Wt 246.0 lb

## 2020-01-15 DIAGNOSIS — K219 Gastro-esophageal reflux disease without esophagitis: Secondary | ICD-10-CM

## 2020-01-15 DIAGNOSIS — E785 Hyperlipidemia, unspecified: Secondary | ICD-10-CM | POA: Diagnosis not present

## 2020-01-15 DIAGNOSIS — I10 Essential (primary) hypertension: Secondary | ICD-10-CM

## 2020-01-15 DIAGNOSIS — I2510 Atherosclerotic heart disease of native coronary artery without angina pectoris: Secondary | ICD-10-CM

## 2020-01-15 MED ORDER — PANTOPRAZOLE SODIUM 40 MG PO TBEC: 40.0000 mg | DELAYED_RELEASE_TABLET | Freq: Every day | ORAL | 3 refills | Status: DC

## 2020-01-15 MED ORDER — EZETIMIBE 10 MG PO TABS: 10.0000 mg | ORAL_TABLET | Freq: Every day | ORAL | 3 refills | Status: DC

## 2020-01-15 NOTE — Patient Instructions (Addendum)
Medication Instructions:  Your physician has recommended you make the following change in your medication: Your omeprazole was discontinue due to cost and Protonix 40mg  by mouth daily was added and a prescription sent to your pharmacy that may be picked up later today.   Zetia 10mg  daily by mouth was also added to your daily medications as well  *If you need a refill on your cardiac medications before your next appointment, please call your pharmacy*   Lab Work: A lipid panel and LFT (heptic panel) was added to be drawn in approximately 6 weeks Walk into medical mall at the check in desk, they will direct you to lab registration, hours for labs are Monday-Friday 07:00am-5:30pm (no appointment necessary)   If you have labs (blood work) drawn today and your tests are completely normal, you will receive your results only by: Marland Kitchen MyChart Message (if you have MyChart) OR . A paper copy in the mail If you have any lab test that is abnormal or we need to change your treatment, we will call you to review the results.   Testing/Procedures: none   Follow-Up: At [facility], you and your health needs are our priority.  As part of our continuing mission to provide you with exceptional heart care, we have created designated Provider Care Teams.  These Care Teams include your primary Cardiologist (physician) and Advanced Practice Providers (APPs -  Physician Assistants and Nurse Practitioners) who all work together to provide you with the care you need, when you need it.  We recommend signing up for the patient portal called "MyChart".  Sign up information is provided on this After Visit Summary.  MyChart is used to connect with patients for Virtual Visits (Telemedicine).  Patients are able to view lab/test results, encounter notes, upcoming appointments, etc.  Non-urgent messages can be sent to your provider as well.   To learn more about what you can do with MyChart, go to [email or web address].     Your next appointment:   6 month(s)  The format for your next appointment:   In Person  Provider:   Nelva Bush, MD   Other Instructions n/a

## 2020-01-15 NOTE — Progress Notes (Signed)
Office Visit    Patient Name: Rodney Briggs Date of Encounter: 01/15/2020  Primary Care Provider:  Juline Patch, MD Primary Cardiologist:  Nelva Bush, MD  Chief Complaint    54 year old male with history of CAD status post PCI to the OM 3 in June 2017, mild mitral regurgitation, GERD, and hyperlipidemia, who presents for follow-up related to CAD.  Past Medical History    Past Medical History:  Diagnosis Date  . CAD S/P percutaneous coronary angioplasty 08/19/2015   a. 08/14/15 Ex MV: med defect of moderate sev- basal inf, basal, inferolat, mid inf, and mid inferolat wall. Findings c/w prior MI in LCx dist w/ mild per-infarct ischemia. EF 43%, intermediate risk; b. 08/19/15 Cath/PCI: occluded OM3 s/p PCI/DES with Xience DES 2.25 mm x 18 mm, ramus 40%, inferolateral wall HK c/w occluded OM branch, nl EF, nl LVEDP; c. 06/2019 ETT: 8:37, 10.1 mets, no ST/T changes.  Marland Kitchen GERD (gastroesophageal reflux disease)   . Gout   . Hyperlipidemia LDL goal <70   . Mild mitral regurgitation    a. echo 08/14/15: EF 60-65%, no RWMA, LV dia fxn nl, mild MR, LA mildly dilated, RV sys fxn nl, PASP nl   Past Surgical History:  Procedure Laterality Date  . CARDIAC CATHETERIZATION Left 08/19/2015   Procedure: Left Heart Cath and Coronary Angiography;  Surgeon: Leonie Man, MD;  Location: [facility];  Service: Cardiovascular;  Laterality: Left;  . CARDIAC CATHETERIZATION N/A 08/19/2015   Procedure: Coronary Stent Intervention;  Surgeon: Leonie Man, MD;  Location: [facility];  Service: Cardiovascular;  Laterality: N/A;    Allergies  Allergies  Allergen Reactions  . Meloxicam Other (See Comments)    Rectal bleeding    History of Present Illness    54 year old male with a prior history of CAD, mild mitral regurgitation, GERD, and hyperlipidemia.  He previously underwent stress testing in 2017 which was notable for basal inferior, basal, inferior, and inferolateral  infarct with mild peri-infarct ischemia.  Diagnostic catheterization revealing occluded OM 3 which was successfully treated with drug-eluting stent.  He otherwise had moderate, nonobstructive ramus intermedius stenosis (40%).  Echocardiogram performed at that time showed an EF of 60-65%.  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  There were no ST or T changes.  He was last seen in cardiology clinic in May of this year, at which time he was hypertensive and carvedilol was increased to 6.25 mg twice daily.  Since then, he has done well.  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  He is not particularly active but does not experience any symptoms or limitations with usual activities.  He denies chest pain, dyspnea, palpitations, PND, orthopnea, dizziness, syncope, edema, or early satiety.  Home Medications    Prior to Admission medications   Medication Sig Start Date End Date Taking? Authorizing Provider  atorvastatin (LIPITOR) 80 MG tablet One a day 12/03/19   Juline Patch, MD  carvedilol (COREG) 6.25 MG tablet Take 1 tablet (6.25 mg total) by mouth 2 (two) times daily with a meal. 12/03/19   Juline Patch, MD  [facility] ASPIRIN ADULT LOW DOSE 81 MG chewable tablet CHEW 1 TABLET BY MOUTH EVERY DAY 04/22/19   End, Harrell Gave, MD  indomethacin (INDOCIN) 25 MG capsule TAKE 1 CAPSULE (25 MG TOTAL) BY MOUTH 2 (  TWO) TIMES DAILY WITH A MEAL. PRN GOUT FLARE 06/14/19   Juline Patch, MD  nitroGLYCERIN (NITROSTAT) 0.4 MG SL tablet Place 1 tablet (0.4 mg total) under the tongue every 5 (five) minutes as needed for chest pain. 06/01/18   End, Harrell Gave, MD  omeprazole (PRILOSEC) 40 MG capsule TAKE 1 CAPSULE BY MOUTH EVERY DAY 12/03/19   Juline Patch, MD    Review of Systems    He denies chest pain, palpitations, dyspnea, pnd, orthopnea, n, v, dizziness, syncope,  edema, weight gain, or early satiety. .  All other systems reviewed and are otherwise negative except as noted above.  Physical Exam    VS:  BP 130/80 (BP Location: Left Arm, Patient Position: Sitting, Cuff Size: Normal)   Pulse 62   Ht 6\' 2"  (1.88 m)   Wt 246 lb (111.6 kg)   SpO2 99%   BMI 31.58 kg/m  , BMI Body mass index is 31.58 kg/m. GEN: Well nourished, well developed, in no acute distress. HEENT: normal. Neck: Supple, no JVD, carotid bruits, or masses. Cardiac: RRR, no murmurs, rubs, or gallops. No clubbing, cyanosis, edema.  Radials 2+, PT 2+ and equal bilaterally.  Respiratory:  Respirations regular and unlabored, clear to auscultation bilaterally. GI: Soft, nontender, nondistended, BS + x 4. MS: no deformity or atrophy. Skin: warm and dry, no rash. Neuro:  Strength and sensation are intact. Psych: Normal affect.  Accessory Clinical Findings    ECG personally reviewed by me today -regular sinus rhythm, 62- no acute changes.  Lab Results  Component Value Date   WBC 7.1 12/05/2018   HGB 14.7 12/05/2018   HCT 43.3 12/05/2018   MCV 88 12/05/2018   PLT 209 12/05/2018   Lab Results  Component Value Date   CREATININE 1.05 12/03/2019   BUN 15 12/03/2019   NA 139 12/03/2019   K 4.2 12/03/2019   CL 104 12/03/2019   CO2 21 12/03/2019   Lab Results  Component Value Date   ALT 17 12/03/2019   AST 14 12/03/2019   ALKPHOS 69 12/03/2019   BILITOT 0.4 12/03/2019   Lab Results  Component Value Date   CHOL 171 12/03/2019   HDL 51 12/03/2019   LDLCALC 85 12/03/2019   TRIG 212 (H) 12/03/2019   CHOLHDL 2.8 01/11/2017    Lab Results  Component Value Date   HGBA1C 5.6 12/13/2019    Assessment & Plan    1.  Coronary artery disease: Status post PCI undergoing stent placement of the OM 3 in 2017.  Known, residual nonobstructive ramus intermedius disease.  He had a normal exercise treadmill test earlier this year.  He has continued to do well without symptoms or  limitations.  He remains on aspirin, beta-blocker, and statin therapy.  Recent LDL was above goal at 85 and I am adding Zetia 10 mg daily.  He has been sedentary and I have encouraged regular exercise and caloric restriction with a goal of weight loss of 1 pound per week.  2.  Essential hypertension: Stable on beta-blocker therapy.  3.  Hyperlipidemia: LDL not at goal-85 in October.  Agreeable to try Zetia 10 mg daily.  We will follow-up lipids and LFTs in approximately 6 weeks.  4.  Morbid obesity: Encourage regular exercise and weight loss.  5.  GERD: Stable though he notes that his insurance will no longer cover Prilosec.  I am sending a prescription for Protonix 40 mg daily today.  6.  Disposition: Follow-up with Dr. Saunders Revel  in 6 months or sooner if necessary.   Ferner Hodgkins, NP 01/15/2020, 8:02 AM

## 2020-04-01 ENCOUNTER — Other Ambulatory Visit: Payer: Self-pay | Admitting: Internal Medicine

## 2020-04-15 ENCOUNTER — Other Ambulatory Visit
Admission: RE | Admit: 2020-04-15 | Discharge: 2020-04-15 | Disposition: A | Discharge status: Home / Self Care | Payer: BC Managed Care – PPO | Source: Ambulatory Visit | Attending: Nurse Practitioner | Admitting: Nurse Practitioner

## 2020-04-15 ENCOUNTER — Other Ambulatory Visit: Payer: Self-pay | Admitting: *Deleted

## 2020-04-15 DIAGNOSIS — E785 Hyperlipidemia, unspecified: Secondary | ICD-10-CM | POA: Diagnosis not present

## 2020-04-15 LAB — HEPATIC FUNCTION PANEL
ALT: 20 U/L (ref 0–44)
AST: 24 U/L (ref 15–41)
Albumin: 4.4 g/dL (ref 3.5–5.0)
Alkaline Phosphatase: 57 U/L (ref 38–126)
Bilirubin, Direct: 0.1 mg/dL (ref 0.0–0.2)
Indirect Bilirubin: 0.9 mg/dL (ref 0.3–0.9)
Total Bilirubin: 1 mg/dL (ref 0.3–1.2)
Total Protein: 7.2 g/dL (ref 6.5–8.1)

## 2020-04-15 LAB — LIPID PANEL
Cholesterol: 156 mg/dL (ref 0–200)
HDL: 61 mg/dL (ref >40)
LDL Cholesterol: 71 mg/dL (ref 0–99)
Total CHOL/HDL Ratio: 2.6 RATIO
Triglycerides: 121 mg/dL (ref <150)
VLDL: 24 mg/dL (ref 0–40)

## 2020-04-15 MED ORDER — EZETIMIBE 10 MG PO TABS: 10.0000 mg | ORAL_TABLET | Freq: Every day | ORAL | 3 refills | Status: DC

## 2020-04-15 MED ORDER — ATORVASTATIN CALCIUM 80 MG PO TABS: ORAL_TABLET | ORAL | 3 refills | Status: DC

## 2020-05-27 ENCOUNTER — Ambulatory Visit: Payer: BC Managed Care – PPO | Admitting: Family Medicine

## 2020-05-27 ENCOUNTER — Encounter: Payer: Self-pay | Admitting: Family Medicine

## 2020-05-27 ENCOUNTER — Other Ambulatory Visit: Payer: Self-pay

## 2020-05-27 VITALS — BP 124/80 | HR 68 | Ht 74.0 in | Wt 242.0 lb

## 2020-05-27 DIAGNOSIS — Z1211 Encounter for screening for malignant neoplasm of colon: Secondary | ICD-10-CM | POA: Diagnosis not present

## 2020-05-27 DIAGNOSIS — I10 Essential (primary) hypertension: Secondary | ICD-10-CM

## 2020-05-27 MED ORDER — CARVEDILOL 6.25 MG PO TABS: 6.2500 mg | ORAL_TABLET | Freq: Two times a day (BID) | ORAL | 1 refills | Status: DC

## 2020-05-27 NOTE — Progress Notes (Signed)
Date:  05/27/2020   Name:  Rodney Briggs   DOB:  06/07/1965   MRN:  [id number]   Chief Complaint: Hypertension and Gout  Hypertension This is a chronic problem. The current episode started more than 1 year ago. The problem has been gradually improving since onset. The problem is controlled. Pertinent negatives include no anxiety, blurred vision, chest pain, headaches, malaise/fatigue, neck pain, orthopnea, palpitations, peripheral edema, PND, shortness of breath or sweats. There are no associated agents to hypertension. Past treatments include beta blockers. The current treatment provides moderate improvement. There are no compliance problems.  Hypertensive end-organ damage includes CVA. There is no history of angina, kidney disease, CAD/MI, heart failure, left ventricular hypertrophy or PVD. Identifiable causes of hypertension include a hypertension causing med.    Lab Results  Component Value Date   CREATININE 1.05 12/03/2019   BUN 15 12/03/2019   NA 139 12/03/2019   K 4.2 12/03/2019   CL 104 12/03/2019   CO2 21 12/03/2019   Lab Results  Component Value Date   CHOL 156 04/15/2020   HDL 61 04/15/2020   LDLCALC 71 04/15/2020   TRIG 121 04/15/2020   CHOLHDL 2.6 04/15/2020   No results found for: TSH Lab Results  Component Value Date   HGBA1C 5.6 12/13/2019   Lab Results  Component Value Date   WBC 7.1 12/05/2018   HGB 14.7 12/05/2018   HCT 43.3 12/05/2018   MCV 88 12/05/2018   PLT 209 12/05/2018   Lab Results  Component Value Date   ALT 20 04/15/2020   AST 24 04/15/2020   ALKPHOS 57 04/15/2020   BILITOT 1.0 04/15/2020     Review of Systems  Constitutional: Negative for chills, fever and malaise/fatigue.  HENT: Negative for drooling, ear discharge, ear pain and sore throat.   Eyes: Negative for blurred vision.  Respiratory: Negative for cough, shortness of breath and wheezing.   Cardiovascular: Negative for chest pain, palpitations, orthopnea, leg swelling and  PND.  Gastrointestinal: Negative for abdominal pain, blood in stool, constipation, diarrhea and nausea.  Endocrine: Negative for polydipsia.  Genitourinary: Negative for dysuria, frequency, hematuria and urgency.  Musculoskeletal: Negative for back pain, myalgias and neck pain.  Skin: Negative for rash.  Allergic/Immunologic: Negative for environmental allergies.  Neurological: Negative for dizziness and headaches.  Hematological: Does not bruise/bleed easily.  Psychiatric/Behavioral: Negative for suicidal ideas. The patient is not nervous/anxious.     Patient Active Problem List   Diagnosis Date Noted  . Elevated blood pressure reading 12/13/2018  . Pain in both upper extremities 06/01/2018  . Hyperlipidemia LDL goal <70 01/18/2017  . Abnormal nuclear stress test 08/19/2015  . CAD S/P percutaneous coronary angioplasty 08/19/2015  . Mild mitral regurgitation     Allergies  Allergen Reactions  . Meloxicam Other (See Comments)    Rectal bleeding    Past Surgical History:  Procedure Laterality Date  . CARDIAC CATHETERIZATION Left 08/19/2015   Procedure: Left Heart Cath and Coronary Angiography;  Surgeon: Leonie Man, MD;  Location: [facility];  Service: Cardiovascular;  Laterality: Left;  . CARDIAC CATHETERIZATION N/A 08/19/2015   Procedure: Coronary Stent Intervention;  Surgeon: Leonie Man, MD;  Location: [facility];  Service: Cardiovascular;  Laterality: N/A;    Social History   Tobacco Use  . Smoking status: Never Smoker  . Smokeless tobacco: Never Used  Substance Use Topics  . Alcohol use: Yes    Alcohol/week: 0.0 standard drinks  .  Drug use: No     Medication list has been reviewed and updated.  Current Meds  Medication Sig  . atorvastatin (LIPITOR) 80 MG tablet One a day  . carvedilol (COREG) 6.25 MG tablet Take 1 tablet (6.25 mg total) by mouth 2 (two) times daily with a meal.  . [facility] ASPIRIN ADULT LOW DOSE 81 MG chewable tablet CHEW  1 TABLET BY MOUTH EVERY DAY  . ezetimibe (ZETIA) 10 MG tablet Take 1 tablet (10 mg total) by mouth daily.  . indomethacin (INDOCIN) 25 MG capsule TAKE 1 CAPSULE (25 MG TOTAL) BY MOUTH 2 (TWO) TIMES DAILY WITH A MEAL. (Patient taking differently: Take 25 mg by mouth 2 (two) times daily as needed.)  . nitroGLYCERIN (NITROSTAT) 0.4 MG SL tablet Place 1 tablet (0.4 mg total) under the tongue every 5 (five) minutes as needed for chest pain.  . pantoprazole (PROTONIX) 40 MG tablet Take 1 tablet (40 mg total) by mouth daily.    PHQ 2/9 Scores 05/27/2020 12/03/2019 10/31/2019 05/27/2019  PHQ - 2 Score 0 0 0 0  PHQ- 9 Score 0 0 3 0    GAD 7 : Generalized Anxiety Score 05/27/2020 12/03/2019 10/31/2019 05/27/2019  Nervous, Anxious, on Edge 0 0 0 0  Control/stop worrying 0 0 0 0  Worry too much - different things 0 0 0 0  Trouble relaxing 0 0 0 0  Restless 0 0 0 0  Easily annoyed or irritable 0 0 0 0  Afraid - awful might happen 0 0 0 0  Total GAD 7 Score 0 0 0 0    BP Readings from Last 3 Encounters:  05/27/20 124/80  01/15/20 130/80  12/03/19 136/78    Physical Exam Vitals and nursing note reviewed.  HENT:     Head: Normocephalic.     Right Ear: External ear normal.     Left Ear: External ear normal.     Nose: Nose normal.  Eyes:     General: No scleral icterus.       Right eye: No discharge.        Left eye: No discharge.     Conjunctiva/sclera: Conjunctivae normal.     Pupils: Pupils are equal, round, and reactive to light.  Neck:     Thyroid: No thyromegaly.     Vascular: No JVD.     Trachea: No tracheal deviation.  Cardiovascular:     Rate and Rhythm: Normal rate and regular rhythm.     Heart sounds: Normal heart sounds. No murmur heard. No friction rub. No gallop.   Pulmonary:     Effort: No respiratory distress.     Breath sounds: Normal breath sounds. No wheezing or rales.  Abdominal:     General: Bowel sounds are normal.     Palpations: Abdomen is soft. There is no mass.      Tenderness: There is no abdominal tenderness. There is no guarding or rebound.  Musculoskeletal:        General: No tenderness. Normal range of motion.     Cervical back: Normal range of motion and neck supple.  Lymphadenopathy:     Cervical: No cervical adenopathy.  Skin:    General: Skin is warm.     Findings: No rash.  Neurological:     Mental Status: He is alert and oriented to person, place, and time.     Cranial Nerves: No cranial nerve deficit.     Deep Tendon Reflexes: Reflexes are normal and symmetric.  Wt Readings from Last 3 Encounters:  05/27/20 242 lb (109.8 kg)  01/15/20 246 lb (111.6 kg)  12/03/19 246 lb (111.6 kg)    BP 124/80   Pulse 68   Ht 6\' 2"  (1.88 m)   Wt 242 lb (109.8 kg)   BMI 31.07 kg/m   Assessment and Plan: 1. Essential hypertension Chronic.  Controlled.  Stable.  Blood pressure today is 124/80.  Patient is doing well with no sequelae of hypertension or medication.  We will continue carvedilol 6.25 mg twice a day.  Patient is followed by cardiology for blood pressure as well as coronary artery disease which is currently stable.  We will obtain a renal function panel for GFR and electrolyte evaluation.  Patient will return in 6 months for reevaluation of blood pressure. - carvedilol (COREG) 6.25 MG tablet; Take 1 tablet (6.25 mg total) by mouth 2 (two) times daily with a meal.  Dispense: 180 tablet; Refill: 1 - Renal Function Panel  2. Colon cancer screening Discussed and referral made to gastroenterology for colonoscopy screening. - Ambulatory referral to Gastroenterology

## 2020-05-28 LAB — RENAL FUNCTION PANEL
Albumin: 4.6 g/dL (ref 3.8–4.9)
BUN/Creatinine Ratio: 13 (ref 9–20)
BUN: 13 mg/dL (ref 6–24)
CO2: 22 mmol/L (ref 20–29)
Calcium: 9.6 mg/dL (ref 8.7–10.2)
Chloride: 102 mmol/L (ref 96–106)
Creatinine, Ser: 1.02 mg/dL (ref 0.76–1.27)
Glucose: 92 mg/dL (ref 65–99)
Phosphorus: 3.4 mg/dL (ref 2.8–4.1)
Potassium: 4.5 mmol/L (ref 3.5–5.2)
Sodium: 141 mmol/L (ref 134–144)
eGFR: 87 mL/min/{1.73_m2} (ref >59)

## 2020-06-11 ENCOUNTER — Encounter: Payer: Self-pay | Admitting: *Deleted

## 2020-06-12 ENCOUNTER — Other Ambulatory Visit: Payer: Self-pay

## 2020-06-12 DIAGNOSIS — Z1211 Encounter for screening for malignant neoplasm of colon: Secondary | ICD-10-CM

## 2020-06-12 MED ORDER — NA SULFATE-K SULFATE-MG SULF 17.5-3.13-1.6 GM/177ML PO SOLN: 1.0000 | Freq: Once | ORAL | 0 refills | Status: AC

## 2020-06-15 ENCOUNTER — Telehealth: Payer: Self-pay | Admitting: Gastroenterology

## 2020-06-15 NOTE — Telephone Encounter (Signed)
Gastroenterology Pre-Procedure Review  Request Date: 07/06/20 Requesting Physician: Dr. Allen Norris  PATIENT REVIEW QUESTIONS: The patient responded to the following health history questions as indicated:    1. Are you having any GI issues? no 2. Do you have a personal history of Polyps? no 3. Do you have a family history of Colon Cancer or Polyps? no 4. Diabetes Mellitus? no 5. Joint replacements in the past 12 months?no 6. Major health problems in the past 3 months?no 7. Any artificial heart valves, MVP, or defibrillator?no    MEDICATIONS & ALLERGIES:    Patient reports the following regarding taking any anticoagulation/antiplatelet therapy:   Plavix, Coumadin, Eliquis, Xarelto, Lovenox, Pradaxa, Brilinta, or Effient? no Aspirin? yes (81 mg)  Pulomonary disease: NO BMI: 31.5 [facility] [place]  Patient confirms/reports the following medications:  Current Outpatient Medications  Medication Sig Dispense Refill  . atorvastatin (LIPITOR) 80 MG tablet One a day 90 tablet 3  . carvedilol (COREG) 6.25 MG tablet Take 1 tablet (6.25 mg total) by mouth 2 (two) times daily with a meal. 180 tablet 1  . [facility] ASPIRIN ADULT LOW DOSE 81 MG chewable tablet CHEW 1 TABLET BY MOUTH EVERY DAY 108 tablet 2  . ezetimibe (ZETIA) 10 MG tablet Take 1 tablet (10 mg total) by mouth daily. 90 tablet 3  . indomethacin (INDOCIN) 25 MG capsule TAKE 1 CAPSULE (25 MG TOTAL) BY MOUTH 2 (TWO) TIMES DAILY WITH A MEAL. (Patient taking differently: Take 25 mg by mouth 2 (two) times daily as needed.) 180 capsule 3  . nitroGLYCERIN (NITROSTAT) 0.4 MG SL tablet Place 1 tablet (0.4 mg total) under the tongue every 5 (five) minutes as needed for chest pain. 25 tablet 3  . pantoprazole (PROTONIX) 40 MG tablet Take 1 tablet (40 mg total) by mouth daily. 90 tablet 3   No current facility-administered medications for this visit.    Patient confirms/reports the following allergies:  Allergies  Allergen Reactions  . Meloxicam Other (See  Comments)    Rectal bleeding    No orders of the defined types were placed in this encounter.   AUTHORIZATION INFORMATION Primary Insurance: 1D#: Group #:  Secondary Insurance: 1D#: Group #:  SCHEDULE INFORMATION: Date:  Time: Location:

## 2020-06-25 ENCOUNTER — Encounter: Payer: Self-pay | Admitting: Gastroenterology

## 2020-06-30 ENCOUNTER — Other Ambulatory Visit: Payer: Self-pay | Admitting: Family Medicine

## 2020-06-30 DIAGNOSIS — M109 Gout, unspecified: Secondary | ICD-10-CM

## 2020-07-03 NOTE — Discharge Instructions (Signed)

## 2020-07-06 ENCOUNTER — Ambulatory Visit: Payer: BC Managed Care – PPO | Admitting: Anesthesiology

## 2020-07-06 ENCOUNTER — Encounter: Payer: Self-pay | Admitting: Gastroenterology

## 2020-07-06 ENCOUNTER — Ambulatory Visit
Admission: RE | Admit: 2020-07-06 | Discharge: 2020-07-06 | Disposition: A | Discharge status: Home / Self Care | Payer: BC Managed Care – PPO | Attending: Gastroenterology | Admitting: Gastroenterology

## 2020-07-06 ENCOUNTER — Encounter
Admission: RE | Disposition: A | Discharge status: Home / Self Care | Payer: Self-pay | Source: Home / Self Care | Attending: Gastroenterology

## 2020-07-06 ENCOUNTER — Other Ambulatory Visit: Payer: Self-pay

## 2020-07-06 DIAGNOSIS — Z860100 Personal history of colon polyps, unspecified: Secondary | ICD-10-CM

## 2020-07-06 DIAGNOSIS — K640 First degree hemorrhoids: Secondary | ICD-10-CM | POA: Diagnosis not present

## 2020-07-06 DIAGNOSIS — Z1211 Encounter for screening for malignant neoplasm of colon: Secondary | ICD-10-CM | POA: Diagnosis not present

## 2020-07-06 DIAGNOSIS — K635 Polyp of colon: Secondary | ICD-10-CM | POA: Diagnosis not present

## 2020-07-06 DIAGNOSIS — Z791 Long term (current) use of non-steroidal anti-inflammatories (NSAID): Secondary | ICD-10-CM | POA: Insufficient documentation

## 2020-07-06 DIAGNOSIS — Z79899 Other long term (current) drug therapy: Secondary | ICD-10-CM | POA: Insufficient documentation

## 2020-07-06 DIAGNOSIS — Z7982 Long term (current) use of aspirin: Secondary | ICD-10-CM | POA: Diagnosis not present

## 2020-07-06 DIAGNOSIS — Z886 Allergy status to analgesic agent status: Secondary | ICD-10-CM | POA: Insufficient documentation

## 2020-07-06 DIAGNOSIS — D122 Benign neoplasm of ascending colon: Secondary | ICD-10-CM | POA: Insufficient documentation

## 2020-07-06 DIAGNOSIS — Z955 Presence of coronary angioplasty implant and graft: Secondary | ICD-10-CM | POA: Diagnosis not present

## 2020-07-06 DIAGNOSIS — D125 Benign neoplasm of sigmoid colon: Secondary | ICD-10-CM | POA: Insufficient documentation

## 2020-07-06 HISTORY — PX: POLYPECTOMY: SHX5525

## 2020-07-06 HISTORY — PX: COLONOSCOPY WITH PROPOFOL: SHX5780

## 2020-07-06 SURGERY — COLONOSCOPY WITH PROPOFOL
Anesthesia: General | Site: Rectum

## 2020-07-06 MED ORDER — LACTATED RINGERS IV SOLN: INTRAVENOUS | Status: DC

## 2020-07-06 MED ORDER — ACETAMINOPHEN 325 MG PO TABS: 325.0000 mg | ORAL_TABLET | ORAL | Status: DC | PRN

## 2020-07-06 MED ORDER — PROPOFOL 10 MG/ML IV BOLUS
INTRAVENOUS | Status: DC | PRN
  Administered 2020-07-06: 30 mg via INTRAVENOUS
  Administered 2020-07-06: 50 mg via INTRAVENOUS
  Administered 2020-07-06: 150 mg via INTRAVENOUS

## 2020-07-06 MED ORDER — ONDANSETRON HCL 4 MG/2ML IJ SOLN: 4.0000 mg | Freq: Once | INTRAMUSCULAR | Status: DC | PRN

## 2020-07-06 MED ORDER — LIDOCAINE HCL (CARDIAC) PF 100 MG/5ML IV SOSY
PREFILLED_SYRINGE | INTRAVENOUS | Status: DC | PRN
  Administered 2020-07-06: 30 mg via INTRAVENOUS

## 2020-07-06 MED ORDER — STERILE WATER FOR IRRIGATION IR SOLN: Status: DC | PRN

## 2020-07-06 MED ORDER — ACETAMINOPHEN 160 MG/5ML PO SOLN: 325.0000 mg | ORAL | Status: DC | PRN

## 2020-07-06 SURGICAL SUPPLY — 8 items
GOWN CVR UNV OPN BCK APRN NK (MISCELLANEOUS) ×2 IMPLANT
GOWN ISOL THUMB LOOP REG UNIV (MISCELLANEOUS) ×4
KIT PRC NS LF DISP ENDO (KITS) ×1 IMPLANT
KIT PROCEDURE OLYMPUS (KITS) ×2
MANIFOLD NEPTUNE II (INSTRUMENTS) ×2 IMPLANT
SNARE COLD EXACTO (MISCELLANEOUS) ×2 IMPLANT
TRAP ETRAP POLY (MISCELLANEOUS) ×2 IMPLANT
WATER STERILE IRR 250ML POUR (IV SOLUTION) ×2 IMPLANT

## 2020-07-06 NOTE — H&P (Signed)
Rodney Lame, MD [facility] [address], [place], [place] [address] Phone: [phone] Fax : [phone]  Primary Care Physician:  Juline Patch, MD Primary Gastroenterologist:  Dr. Allen Norris  Pre-Procedure History & Physical: HPI:  Rodney Briggs is a 55 y.o. male is here for a screening colonoscopy.   Past Medical History:  Diagnosis Date  . CAD S/P percutaneous coronary angioplasty 08/19/2015   a. 08/14/15 Ex MV: med defect of moderate sev- basal inf, basal, inferolat, mid inf, and mid inferolat wall. Findings c/w prior MI in LCx dist w/ mild per-infarct ischemia. EF 43%, intermediate risk; b. 08/19/15 Cath/PCI: occluded OM3 s/p PCI/DES with Xience DES 2.25 mm x 18 mm, ramus 40%, inferolateral wall HK c/w occluded OM branch, nl EF, nl LVEDP; c. 06/2019 ETT: 8:37, 10.1 mets, no ST/T changes.  Marland Kitchen GERD (gastroesophageal reflux disease)   . Gout   . Hyperlipidemia LDL goal <70   . Mild mitral regurgitation    a. echo 08/14/15: EF 60-65%, no RWMA, LV dia fxn nl, mild MR, LA mildly dilated, RV sys fxn nl, PASP nl    Past Surgical History:  Procedure Laterality Date  . CARDIAC CATHETERIZATION Left 08/19/2015   Procedure: Left Heart Cath and Coronary Angiography;  Surgeon: Leonie Man, MD;  Location: [facility];  Service: Cardiovascular;  Laterality: Left;  . CARDIAC CATHETERIZATION N/A 08/19/2015   Procedure: Coronary Stent Intervention;  Surgeon: Leonie Man, MD;  Location: [facility];  Service: Cardiovascular;  Laterality: N/A;    Prior to Admission medications   Medication Sig Start Date End Date Taking? Authorizing Provider  atorvastatin (LIPITOR) 80 MG tablet One a day 04/15/20  Yes Theora Gianotti, NP  carvedilol (COREG) 6.25 MG tablet Take 1 tablet (6.25 mg total) by mouth 2 (two) times daily with a meal. 05/27/20  Yes Juline Patch, MD  [facility] ASPIRIN ADULT LOW DOSE 81 MG chewable tablet CHEW 1 TABLET BY MOUTH EVERY DAY 04/01/20  Yes End,  Harrell Gave, MD  ezetimibe (ZETIA) 10 MG tablet Take 1 tablet (10 mg total) by mouth daily. 04/15/20 07/14/20 Yes Theora Gianotti, NP  indomethacin (INDOCIN) 25 MG capsule TAKE 1 CAPSULE (25 MG TOTAL) BY MOUTH 2 (TWO) TIMES DAILY WITH A MEAL. 06/30/20  Yes Juline Patch, MD  nitroGLYCERIN (NITROSTAT) 0.4 MG SL tablet Place 1 tablet (0.4 mg total) under the tongue every 5 (five) minutes as needed for chest pain. 06/01/18  Yes End, Harrell Gave, MD  pantoprazole (PROTONIX) 40 MG tablet Take 1 tablet (40 mg total) by mouth daily. 01/15/20  Yes Theora Gianotti, NP    Allergies as of 06/12/2020 - Review Complete 05/27/2020  Allergen Reaction Noted  . Meloxicam Other (See Comments) 11/19/2019    Family History  Problem Relation Age of Onset  . Heart disease Mother   . Stroke Mother   . Heart disease Father     Social History   Socioeconomic History  . Marital status: Divorced    Spouse name: Not on file  . Number of children: Not on file  . Years of education: Not on file  . Highest education level: Not on file  Occupational History  . Not on file  Tobacco Use  . Smoking status: Never Smoker  . Smokeless tobacco: Never Used  Substance and Sexual Activity  . Alcohol use: Yes    Alcohol/week: 24.0 standard drinks    Types: 24 Cans of beer per week  .  Drug use: No  . Sexual activity: Yes  Other Topics Concern  . Not on file  Social History Narrative  . Not on file   Social Determinants of Health   Financial Resource Strain: Not on file  Food Insecurity: Not on file  Transportation Needs: Not on file  Physical Activity: Not on file  Stress: Not on file  Social Connections: Not on file  Intimate Partner Violence: Not on file    Review of Systems: See HPI, otherwise negative ROS  Physical Exam: There were no vitals taken for this visit. General:   Alert,  pleasant and cooperative in NAD Head:  Normocephalic and atraumatic. Neck:  Supple; no masses or  thyromegaly. Lungs:  Clear throughout to auscultation.    Heart:  Regular rate and rhythm. Abdomen:  Soft, nontender and nondistended. Normal bowel sounds, without guarding, and without rebound.   Neurologic:  Alert and  oriented x4;  grossly normal neurologically.  Impression/Plan: Rodney Briggs is now here to undergo a screening colonoscopy.  Risks, benefits, and alternatives regarding colonoscopy have been reviewed with the patient.  Questions have been answered.  All parties agreeable.

## 2020-07-06 NOTE — Anesthesia Postprocedure Evaluation (Signed)
Anesthesia Post Note  Patient: Rodney Briggs  Procedure(s) Performed: COLONOSCOPY WITH BIOPSY (N/A Rectum) POLYPECTOMY (N/A Rectum)     Patient location during evaluation: PACU Anesthesia Type: General Level of consciousness: awake Pain management: pain level controlled Vital Signs Assessment: post-procedure vital signs reviewed and stable Respiratory status: respiratory function stable Cardiovascular status: stable Postop Assessment: no signs of nausea or vomiting Anesthetic complications: no   No complications documented.  Veda Canning

## 2020-07-06 NOTE — Transfer of Care (Signed)
Immediate Anesthesia Transfer of Care Note  Patient: Rodney Briggs  Procedure(s) Performed: COLONOSCOPY WITH BIOPSY (N/A Rectum) POLYPECTOMY (N/A Rectum)  Patient Location: PACU  Anesthesia Type: General  Level of Consciousness: awake, alert  and patient cooperative  Airway and Oxygen Therapy: Patient Spontanous Breathing and Patient connected to supplemental oxygen  Post-op Assessment: Post-op Vital signs reviewed, Patient's Cardiovascular Status Stable, Respiratory Function Stable, Patent Airway and No signs of Nausea or vomiting  Post-op Vital Signs: Reviewed and stable  Complications: No complications documented.

## 2020-07-06 NOTE — Op Note (Signed)
[facility] Gastroenterology Patient Name: Rodney Briggs Procedure Date: 07/06/2020 8:37 AM MRN: [id number] Account #: [id number] Date of Birth: [birth year] Admit Type: Outpatient Age: 55 Room: [facility] OR ROOM 01 Gender: Male Note Status: Finalized Procedure:             Colonoscopy Indications:           Screening for colorectal malignant neoplasm Providers:             [name] MD, MD Referring MD:          Juline Patch, MD (Referring MD) Medicines:             Propofol per Anesthesia Complications:         No immediate complications. Procedure:             Pre-Anesthesia Assessment:                        - Prior to the procedure, a History and Physical was                         performed, and patient medications and allergies were                         reviewed. The patient's tolerance of previous                         anesthesia was also reviewed. The risks and benefits                         of the procedure and the sedation options and risks                         were discussed with the patient. All questions were                         answered, and informed consent was obtained. Prior                         Anticoagulants: The patient has taken no previous                         anticoagulant or antiplatelet agents. ASA Grade                         Assessment: II - A patient with mild systemic disease.                         After reviewing the risks and benefits, the patient                         was deemed in satisfactory condition to undergo the                         procedure.                        After obtaining informed consent, the colonoscope was  passed under direct vision. Throughout the procedure,                         the patient's blood pressure, pulse, and oxygen                         saturations were monitored continuously. The                         Colonoscope was introduced through the  anus and                         advanced to the the cecum, identified by appendiceal                         orifice and ileocecal valve. The colonoscopy was                         performed without difficulty. The patient tolerated                         the procedure well. The quality of the bowel                         preparation was excellent. Findings:      The perianal and digital rectal examinations were normal.      Two sessile polyps were found in the ascending colon. The polyps were 3       to 5 mm in size. These polyps were removed with a cold snare. Resection       and retrieval were complete.      A 5 mm polyp was found in the sigmoid colon. The polyp was sessile. The       polyp was removed with a cold snare. Resection and retrieval were       complete.      Non-bleeding internal hemorrhoids were found during retroflexion. The       hemorrhoids were Grade I (internal hemorrhoids that do not prolapse). Impression:            - Two 3 to 5 mm polyps in the ascending colon, removed                         with a cold snare. Resected and retrieved.                        - One 5 mm polyp in the sigmoid colon, removed with a                         cold snare. Resected and retrieved.                        - Non-bleeding internal hemorrhoids. Recommendation:        - Discharge patient to home.                        - Resume previous diet.                        - Continue present medications.                        -  Await pathology results.                        - Repeat colonoscopy in 5 years for surveillance if                         adenomatous and 10 years if hyperplastic. Procedure Code(s):     --- Professional ---                        [phone], Colonoscopy, flexible; with removal of                         tumor(s), polyp(s), or other lesion(s) by snare                         technique Diagnosis Code(s):     --- Professional ---                        Z12.11,  Encounter for screening for malignant neoplasm                         of colon                        K63.5, Polyp of colon CPT copyright 2019 American Medical Association. All rights reserved. The codes documented in this report are preliminary and upon coder review may  be revised to meet current compliance requirements. [name] MD, MD 07/06/2020 9:01:31 AM This report has been signed electronically. Number of Addenda: 0 Note Initiated On: 07/06/2020 8:37 AM Scope Withdrawal Time: 0 hours 7 minutes 28 seconds  Total Procedure Duration: 0 hours 11 minutes 39 seconds  Estimated Blood Loss:  Estimated blood loss: none.      [facility]

## 2020-07-06 NOTE — Anesthesia Preprocedure Evaluation (Signed)
Anesthesia Evaluation  Patient identified by MRN, date of birth, ID band Patient awake    Reviewed: Allergy & Precautions, NPO status   Airway Mallampati: II  TM Distance: >3 FB     Dental   Pulmonary    Pulmonary exam normal        Cardiovascular + CAD and + Cardiac Stents   Rhythm:Regular Rate:Normal  HLD  Normal exercise stress test 06/2019   Neuro/Psych    GI/Hepatic GERD  ,  Endo/Other    Renal/GU      Musculoskeletal   Abdominal   Peds  Hematology   Anesthesia Other Findings   Reproductive/Obstetrics                             Anesthesia Physical Anesthesia Plan  ASA: III  Anesthesia Plan: General   Post-op Pain Management:    Induction: Intravenous  PONV Risk Score and Plan: Propofol infusion, TIVA and Treatment may vary due to age or medical condition  Airway Management Planned: Natural Airway and Nasal Cannula  Additional Equipment:   Intra-op Plan:   Post-operative Plan:   Informed Consent: I have reviewed the patients History and Physical, chart, labs and discussed the procedure including the risks, benefits and alternatives for the proposed anesthesia with the patient or authorized representative who has indicated his/her understanding and acceptance.       Plan Discussed with: CRNA  Anesthesia Plan Comments:         Anesthesia Quick Evaluation

## 2020-07-06 NOTE — Anesthesia Procedure Notes (Signed)
Date/Time: 07/06/2020 8:43 AM Performed by: Cameron Ali, CRNA Pre-anesthesia Checklist: Patient identified, Emergency Drugs available, Suction available, Timeout performed and Patient being monitored Patient Re-evaluated:Patient Re-evaluated prior to induction Oxygen Delivery Method: Nasal cannula Placement Confirmation: positive ETCO2

## 2020-07-07 ENCOUNTER — Encounter: Payer: Self-pay | Admitting: Gastroenterology

## 2020-07-07 LAB — SURGICAL PATHOLOGY

## 2020-07-09 ENCOUNTER — Encounter: Payer: Self-pay | Admitting: Gastroenterology

## 2020-07-15 ENCOUNTER — Other Ambulatory Visit: Payer: Self-pay

## 2020-07-15 ENCOUNTER — Telehealth: Payer: Self-pay | Admitting: Family Medicine

## 2020-07-15 ENCOUNTER — Encounter: Payer: Self-pay | Admitting: Internal Medicine

## 2020-07-15 ENCOUNTER — Ambulatory Visit: Payer: BC Managed Care – PPO | Admitting: Internal Medicine

## 2020-07-15 VITALS — BP 120/84 | HR 57 | Ht 74.0 in | Wt 235.0 lb

## 2020-07-15 DIAGNOSIS — E785 Hyperlipidemia, unspecified: Secondary | ICD-10-CM

## 2020-07-15 DIAGNOSIS — I2510 Atherosclerotic heart disease of native coronary artery without angina pectoris: Secondary | ICD-10-CM | POA: Diagnosis not present

## 2020-07-15 DIAGNOSIS — I10 Essential (primary) hypertension: Secondary | ICD-10-CM | POA: Diagnosis not present

## 2020-07-15 NOTE — Telephone Encounter (Signed)
Pt would like a medication for sea sickness called into [facility] [address] in [place]/ please advise

## 2020-07-15 NOTE — Telephone Encounter (Signed)
Patient refused appointment.

## 2020-07-15 NOTE — Progress Notes (Signed)
Follow-up Outpatient Visit Date: 07/15/2020  Primary Care Provider: Juline Patch, MD [address] [place] [address]  Chief Complaint: Follow-up coronary artery disease  HPI:  Rodney Briggs is a 55 y.o. male with history of coronary artery disease status post PCI to OM3 (07/2015), mild mitral regurgitation, and GERD, who presents for follow-up of coronary artery disease.  He was last seen in our office in 12/2019 by Ignacia Bayley, NP, at which time he was doing well.  Due to suboptimal lipid control, ezetimibe was added to atorvastatin 80 mg daily.  LDL in 03/2019 was 71.  Today, Rodney Briggs reports that he has been feeling well, denying chest pain, shortness of breath, palpitations, lightheadedness, or edema.  He is tolerating his medications, including the combination of atorvastatin and ezetimibe, well.  He does not exercise regularly but stays active at work.  --------------------------------------------------------------------------------------------------  Past Medical History:  Diagnosis Date  . CAD S/P percutaneous coronary angioplasty 08/19/2015   a. 08/14/15 Ex MV: med defect of moderate sev- basal inf, basal, inferolat, mid inf, and mid inferolat wall. Findings c/w prior MI in LCx dist w/ mild per-infarct ischemia. EF 43%, intermediate risk; b. 08/19/15 Cath/PCI: occluded OM3 s/p PCI/DES with Xience DES 2.25 mm x 18 mm, ramus 40%, inferolateral wall HK c/w occluded OM branch, nl EF, nl LVEDP; c. 06/2019 ETT: 8:37, 10.1 mets, no ST/T changes.  Marland Kitchen GERD (gastroesophageal reflux disease)   . Gout   . Hyperlipidemia LDL goal <70   . Mild mitral regurgitation    a. echo 08/14/15: EF 60-65%, no RWMA, LV dia fxn nl, mild MR, LA mildly dilated, RV sys fxn nl, PASP nl   Past Surgical History:  Procedure Laterality Date  . CARDIAC CATHETERIZATION Left 08/19/2015   Procedure: Left Heart Cath and Coronary Angiography;  Surgeon: Leonie Man, MD;  Location: [facility];   Service: Cardiovascular;  Laterality: Left;  . CARDIAC CATHETERIZATION N/A 08/19/2015   Procedure: Coronary Stent Intervention;  Surgeon: Leonie Man, MD;  Location: [facility];  Service: Cardiovascular;  Laterality: N/A;  . COLONOSCOPY WITH PROPOFOL N/A 07/06/2020   Procedure: COLONOSCOPY WITH BIOPSY;  Surgeon: [name], MD;  Location: [facility];  Service: Endoscopy;  Laterality: N/A;  . POLYPECTOMY N/A 07/06/2020   Procedure: POLYPECTOMY;  Surgeon: [name], MD;  Location: [facility];  Service: Endoscopy;  Laterality: N/A;    Current Meds  Medication Sig  . atorvastatin (LIPITOR) 80 MG tablet One a day  . carvedilol (COREG) 6.25 MG tablet Take 1 tablet (6.25 mg total) by mouth 2 (two) times daily with a meal.  . [facility] ASPIRIN ADULT LOW DOSE 81 MG chewable tablet CHEW 1 TABLET BY MOUTH EVERY DAY  . ezetimibe (ZETIA) 10 MG tablet Take 1 tablet (10 mg total) by mouth daily.  . indomethacin (INDOCIN) 25 MG capsule TAKE 1 CAPSULE (25 MG TOTAL) BY MOUTH 2 (TWO) TIMES DAILY WITH A MEAL.  . nitroGLYCERIN (NITROSTAT) 0.4 MG SL tablet Place 1 tablet (0.4 mg total) under the tongue every 5 (five) minutes as needed for chest pain.  . pantoprazole (PROTONIX) 40 MG tablet Take 1 tablet (40 mg total) by mouth daily.    Allergies: Meloxicam  Social History   Tobacco Use  . Smoking status: Never Smoker  . Smokeless tobacco: Never Used  Vaping Use  . Vaping Use: Never used  Substance Use Topics  . Alcohol use: Yes    Alcohol/week:  24.0 standard drinks    Types: 24 Cans of beer per week    Comment: weekly  . Drug use: No    Family History  Problem Relation Age of Onset  . Heart disease Mother   . Stroke Mother   . Heart disease Father     Review of Systems: A 12-system review of systems was performed and was negative except as noted in the  HPI.  --------------------------------------------------------------------------------------------------  Physical Exam: BP 120/84 (BP Location: Left Arm, Patient Position: Sitting, Cuff Size: Large)   Pulse (!) 57   Ht 6\' 2"  (1.88 m)   Wt 235 lb (106.6 kg)   SpO2 (!) 57%   BMI 30.17 kg/m   General:  NAD. Neck: No JVD or HJR. Lungs: Clear to auscultation bilaterally without wheezes or crackles. Heart: Regular rate and rhythm without murmurs, rubs, or gallops. Abdomen: Soft, nontender, nondistended. Extremities: No lower extremity edema.  EKG: Normal sinus rhythm with poor R wave progression in V2 and V3, most likely related to lead placement.  No significant change from prior tracing on 01/15/2020.  Lab Results  Component Value Date   WBC 7.1 12/05/2018   HGB 14.7 12/05/2018   HCT 43.3 12/05/2018   MCV 88 12/05/2018   PLT 209 12/05/2018    Lab Results  Component Value Date   NA 141 05/27/2020   K 4.5 05/27/2020   CL 102 05/27/2020   CO2 22 05/27/2020   BUN 13 05/27/2020   CREATININE 1.02 05/27/2020   GLUCOSE 92 05/27/2020   ALT 20 04/15/2020    Lab Results  Component Value Date   CHOL 156 04/15/2020   HDL 61 04/15/2020   LDLCALC 71 04/15/2020   TRIG 121 04/15/2020   CHOLHDL 2.6 04/15/2020    --------------------------------------------------------------------------------------------------  ASSESSMENT AND PLAN: Coronary artery disease: Rodney Briggs continues to do well without recurrent angina.  We will continue his current medications for secondary prevention.  Hypertension: Blood pressure upper normal today.  No medication changes at this time.  Hyperlipidemia: Lipids improved with addition of ezetimibe, though LDL remains just above goal.  We have agreed to defer medication changes and will continue atorvastatin 80 mg daily and ezetimibe 10 mg daily.  Lifestyle modifications encouraged.  Follow-up: Return to clinic in 1 year.  Nelva Bush,  MD 07/15/2020 8:13 AM

## 2020-07-15 NOTE — Patient Instructions (Signed)
Medication Instructions:  Your physician recommends that you continue on your current medications as directed. Please refer to the Current Medication list given to you today.  *If you need a refill on your cardiac medications before your next appointment, please call your pharmacy*   Lab Work: None ordered  If you have labs (blood work) drawn today and your tests are completely normal, you will receive your results only by: Marland Kitchen MyChart Message (if you have MyChart) OR . A paper copy in the mail If you have any lab test that is abnormal or we need to change your treatment, we will call you to review the results.   Testing/Procedures: None ordered   Follow-Up: At [facility], you and your health needs are our priority.  As part of our continuing mission to provide you with exceptional heart care, we have created designated Provider Care Teams.  These Care Teams include your primary Cardiologist (physician) and Advanced Practice Providers (APPs -  Physician Assistants and Nurse Practitioners) who all work together to provide you with the care you need, when you need it.  We recommend signing up for the patient portal called "MyChart".  Sign up information is provided on this After Visit Summary.  MyChart is used to connect with patients for Virtual Visits (Telemedicine).  Patients are able to view lab/test results, encounter notes, upcoming appointments, etc.  Non-urgent messages can be sent to your provider as well.   To learn more about what you can do with MyChart, go to [email or web address].    Your next appointment:   1 year(s)  The format for your next appointment:   In Person  Provider:   You may see Nelva Bush, MD or one of the following Advanced Practice Providers on your designated Care Team:    Luger Hodgkins, NP  Christell Faith, PA-C  Marrianne Mood, PA-C  Cadence [place], [place]  Laurann [place], NP

## 2020-08-18 ENCOUNTER — Telehealth: Payer: Self-pay

## 2020-08-18 ENCOUNTER — Other Ambulatory Visit: Payer: Self-pay

## 2020-08-18 DIAGNOSIS — T753XXA Motion sickness, initial encounter: Secondary | ICD-10-CM

## 2020-08-18 MED ORDER — SCOPOLAMINE 1 MG/3DAYS TD PT72: 1.0000 | MEDICATED_PATCH | TRANSDERMAL | 0 refills | Status: DC

## 2020-08-18 NOTE — Telephone Encounter (Unsigned)
Copied from [place] [phone]. Topic: General - Other >> Aug 18, 2020  2:35 PM Loma Boston wrote: Reason for CRM: pt wants Dr Ronnald Ramp nurse to cb, would not leave any detail and did not stay on ph for me to verify # assuming # he was calling on. [phone]

## 2020-08-18 NOTE — Progress Notes (Signed)
Sent in scop patches #4- will need to see next time

## 2020-10-19 ENCOUNTER — Other Ambulatory Visit: Payer: Self-pay | Admitting: Family Medicine

## 2020-10-19 ENCOUNTER — Other Ambulatory Visit: Payer: Self-pay | Admitting: Nurse Practitioner

## 2020-10-19 DIAGNOSIS — M109 Gout, unspecified: Secondary | ICD-10-CM

## 2020-12-01 ENCOUNTER — Encounter: Payer: Self-pay | Admitting: Family Medicine

## 2020-12-01 ENCOUNTER — Ambulatory Visit: Payer: BC Managed Care – PPO | Admitting: Family Medicine

## 2020-12-01 ENCOUNTER — Other Ambulatory Visit: Payer: Self-pay

## 2020-12-01 VITALS — BP 130/82 | HR 68 | Ht 74.0 in | Wt 241.0 lb

## 2020-12-01 DIAGNOSIS — K219 Gastro-esophageal reflux disease without esophagitis: Secondary | ICD-10-CM | POA: Diagnosis not present

## 2020-12-01 DIAGNOSIS — I10 Essential (primary) hypertension: Secondary | ICD-10-CM | POA: Diagnosis not present

## 2020-12-01 DIAGNOSIS — M109 Gout, unspecified: Secondary | ICD-10-CM | POA: Diagnosis not present

## 2020-12-01 DIAGNOSIS — Z23 Encounter for immunization: Secondary | ICD-10-CM | POA: Diagnosis not present

## 2020-12-01 DIAGNOSIS — E7801 Familial hypercholesterolemia: Secondary | ICD-10-CM | POA: Diagnosis not present

## 2020-12-01 DIAGNOSIS — N529 Male erectile dysfunction, unspecified: Secondary | ICD-10-CM

## 2020-12-01 MED ORDER — SILDENAFIL CITRATE 100 MG PO TABS: 100.0000 mg | ORAL_TABLET | Freq: Every day | ORAL | 6 refills | Status: DC | PRN

## 2020-12-01 MED ORDER — PANTOPRAZOLE SODIUM 40 MG PO TBEC: 40.0000 mg | DELAYED_RELEASE_TABLET | Freq: Every day | ORAL | 1 refills | Status: DC

## 2020-12-01 MED ORDER — CARVEDILOL 6.25 MG PO TABS: 6.2500 mg | ORAL_TABLET | Freq: Two times a day (BID) | ORAL | 1 refills | Status: DC

## 2020-12-01 NOTE — Progress Notes (Signed)
Date:  12/01/2020   Name:  Rodney Briggs   DOB:  1966/02/09   MRN:  [id number]   Chief Complaint: Gastroesophageal Reflux, Gout, Hypertension, and Erectile Dysfunction (Trouble with getting erection and keeping it)  Gastroesophageal Reflux He reports no abdominal pain, no belching, no chest pain, no choking, no coughing, no dysphagia, no early satiety, no heartburn, no hoarse voice, no nausea, no sore throat or no wheezing. This is a chronic problem. The current episode started more than 1 year ago. The problem occurs rarely. The problem has been resolved. The symptoms are aggravated by lying down. Pertinent negatives include no anemia, fatigue, melena, muscle weakness, orthopnea or weight loss. He has tried head elevation, an antacid and a PPI for the symptoms. The treatment provided significant relief.  Hypertension This is a chronic problem. The current episode started more than 1 year ago. The problem has been resolved since onset. The problem is controlled. Pertinent negatives include no anxiety, blurred vision, chest pain, headaches, malaise/fatigue, neck pain, palpitations, peripheral edema or shortness of breath. The current treatment provides significant improvement. Compliance problems include diet and exercise.   Erectile Dysfunction This is a new problem. The current episode started more than 1 year ago. The problem has been gradually worsening since onset. The nature of his difficulty is achieving erection and maintaining erection. He reports no anxiety, decreased libido or performance anxiety. He reports his erection duration to be 1 to 5 minutes. Irritative symptoms do not include frequency, nocturia or urgency. Obstructive symptoms do not include a slower stream or a weak stream. Pertinent negatives include no chills, dysuria, genital pain, hematuria, hesitancy or inability to urinate. Past treatments include sildenafil. The treatment provided mild relief. He has been using treatment  for 1 to 6 months. He has had no adverse reactions caused by medications.   Lab Results  Component Value Date   CREATININE 1.02 05/27/2020   BUN 13 05/27/2020   NA 141 05/27/2020   K 4.5 05/27/2020   CL 102 05/27/2020   CO2 22 05/27/2020   Lab Results  Component Value Date   CHOL 156 04/15/2020   HDL 61 04/15/2020   LDLCALC 71 04/15/2020   TRIG 121 04/15/2020   CHOLHDL 2.6 04/15/2020   No results found for: TSH Lab Results  Component Value Date   HGBA1C 5.6 12/13/2019   Lab Results  Component Value Date   WBC 7.1 12/05/2018   HGB 14.7 12/05/2018   HCT 43.3 12/05/2018   MCV 88 12/05/2018   PLT 209 12/05/2018   Lab Results  Component Value Date   ALT 20 04/15/2020   AST 24 04/15/2020   ALKPHOS 57 04/15/2020   BILITOT 1.0 04/15/2020     Review of Systems  Constitutional:  Negative for chills, fatigue, malaise/fatigue and weight loss.  HENT:  Negative for hoarse voice and sore throat.   Eyes:  Negative for blurred vision.  Respiratory:  Negative for cough, choking, shortness of breath and wheezing.   Cardiovascular:  Negative for chest pain and palpitations.  Gastrointestinal:  Negative for abdominal pain, dysphagia, heartburn, melena and nausea.  Genitourinary:  Negative for decreased libido, dysuria, frequency, hematuria, hesitancy, nocturia and urgency.  Musculoskeletal:  Negative for muscle weakness and neck pain.  Neurological:  Negative for headaches.  Psychiatric/Behavioral:  The patient is not nervous/anxious.    Patient Active Problem List   Diagnosis Date Noted   Special screening for malignant neoplasms, colon    Polyp of ascending  colon    Essential hypertension 12/13/2018   Elevated blood pressure reading 12/13/2018   Pain in both upper extremities 06/01/2018   Hyperlipidemia LDL goal <70 01/18/2017   Abnormal nuclear stress test 08/19/2015   Coronary artery disease involving native coronary artery of native heart without angina pectoris  08/19/2015   Mild mitral regurgitation     Allergies  Allergen Reactions   Meloxicam Other (See Comments)    Rectal bleeding    Past Surgical History:  Procedure Laterality Date   CARDIAC CATHETERIZATION Left 08/19/2015   Procedure: Left Heart Cath and Coronary Angiography;  Surgeon: Leonie Man, MD;  Location: [facility];  Service: Cardiovascular;  Laterality: Left;   CARDIAC CATHETERIZATION N/A 08/19/2015   Procedure: Coronary Stent Intervention;  Surgeon: Leonie Man, MD;  Location: [facility];  Service: Cardiovascular;  Laterality: N/A;   COLONOSCOPY WITH PROPOFOL N/A 07/06/2020   Procedure: COLONOSCOPY WITH BIOPSY;  Surgeon: [name], MD;  Location: [facility];  Service: Endoscopy;  Laterality: N/A;   POLYPECTOMY N/A 07/06/2020   Procedure: POLYPECTOMY;  Surgeon: [name], MD;  Location: [facility];  Service: Endoscopy;  Laterality: N/A;    Social History   Tobacco Use   Smoking status: Never   Smokeless tobacco: Never  Vaping Use   Vaping Use: Never used  Substance Use Topics   Alcohol use: Yes    Alcohol/week: 24.0 standard drinks    Types: 24 Cans of beer per week    Comment: weekly   Drug use: No     Medication list has been reviewed and updated.  Current Meds  Medication Sig   atorvastatin (LIPITOR) 80 MG tablet One a day   carvedilol (COREG) 6.25 MG tablet Take 1 tablet (6.25 mg total) by mouth 2 (two) times daily with a meal.   [facility] ASPIRIN ADULT LOW DOSE 81 MG chewable tablet CHEW 1 TABLET BY MOUTH EVERY DAY   indomethacin (INDOCIN) 25 MG capsule TAKE 1 CAPSULE (25 MG TOTAL) BY MOUTH 2 (TWO) TIMES DAILY WITH A MEAL.   nitroGLYCERIN (NITROSTAT) 0.4 MG SL tablet Place 1 tablet (0.4 mg total) under the tongue every 5 (five) minutes as needed for chest pain.   pantoprazole (PROTONIX) 40 MG tablet TAKE 1 TABLET BY MOUTH EVERY DAY    PHQ 2/9 Scores 12/01/2020 05/27/2020 12/03/2019 10/31/2019  PHQ - 2 Score 0 0 0 0   PHQ- 9 Score 0 0 0 3    GAD 7 : Generalized Anxiety Score 12/01/2020 05/27/2020 12/03/2019 10/31/2019  Nervous, Anxious, on Edge 0 0 0 0  Control/stop worrying 0 0 0 0  Worry too much - different things 0 0 0 0  Trouble relaxing 0 0 0 0  Restless 0 0 0 0  Easily annoyed or irritable 0 0 0 0  Afraid - awful might happen 0 0 0 0  Total GAD 7 Score 0 0 0 0    BP Readings from Last 3 Encounters:  12/01/20 130/82  07/15/20 120/84  07/06/20 104/71    Physical Exam Vitals and nursing note reviewed.  HENT:     Head: Normocephalic.     Right Ear: Tympanic membrane, ear canal and external ear normal.     Left Ear: Tympanic membrane, ear canal and external ear normal.     Nose: Nose normal. No congestion or rhinorrhea.  Eyes:     General: No scleral icterus.       Right eye: No  discharge.        Left eye: No discharge.     Conjunctiva/sclera: Conjunctivae normal.     Pupils: Pupils are equal, round, and reactive to light.  Neck:     Thyroid: No thyromegaly.     Vascular: No JVD.     Trachea: No tracheal deviation.  Cardiovascular:     Rate and Rhythm: Normal rate and regular rhythm.     Heart sounds: Normal heart sounds. No murmur heard.   No friction rub. No gallop.  Pulmonary:     Effort: No respiratory distress.     Breath sounds: Normal breath sounds. No wheezing, rhonchi or rales.  Abdominal:     General: Bowel sounds are normal.     Palpations: Abdomen is soft. There is no mass.     Tenderness: There is no abdominal tenderness. There is no guarding or rebound.  Musculoskeletal:        General: No tenderness. Normal range of motion.     Cervical back: Normal range of motion and neck supple.  Lymphadenopathy:     Cervical: No cervical adenopathy.  Skin:    General: Skin is warm.     Findings: No rash.  Neurological:     Mental Status: He is alert and oriented to person, place, and time.     Cranial Nerves: No cranial nerve deficit.     Deep Tendon Reflexes: Reflexes  are normal and symmetric.    Wt Readings from Last 3 Encounters:  12/01/20 241 lb (109.3 kg)  07/15/20 235 lb (106.6 kg)  07/06/20 233 lb (105.7 kg)    BP 130/82   Pulse 68   Ht 6\' 2"  (1.88 m)   Wt 241 lb (109.3 kg)   BMI 30.94 kg/m   Assessment and Plan:  1. Essential hypertension Chronic.  Controlled.  Stable.  Blood pressure 130/82.  Continue carvedilol 6.25 mg 1 twice a day for 10 days we will check renal function panel for electrolytes and GFR. - carvedilol (COREG) 6.25 MG tablet; Take 1 tablet (6.25 mg total) by mouth 2 (two) times daily with a meal.  Dispense: 180 tablet; Refill: 1 - Renal Function Panel  2. Gout, unspecified cause, unspecified chronicity, unspecified site Patient with episodic acute gout attacks for which she is currently stable.  Patient has ample supply of indomethacin and we will continue to take this on an as-needed basis for flares.  3. Familial hypercholesterolemia Chronic.  Controlled.  Stable.  Currently is taking atorvastatin 80 mg per cardiology and we will check lipid panel as patient is fasting today. - Lipid Panel With LDL/HDL Ratio  4. Gastroesophageal reflux disease, unspecified whether esophagitis present Chronic.  Controlled.  Stable.  Continue pantoprazole 40 mg once a day. - pantoprazole (PROTONIX) 40 MG tablet; Take 1 tablet (40 mg total) by mouth daily.  Dispense: 90 tablet; Refill: 1  5. Vasculogenic erectile dysfunction, unspecified vasculogenic erectile dysfunction type Chronic.  Episodic.  As-needed basis.  Patient has mail order Viagra which is requiring 100 mg when needed.  We will refill this for 6 months. - sildenafil (VIAGRA) 100 MG tablet; Take 1 tablet (100 mg total) by mouth daily as needed for erectile dysfunction.  Dispense: 15 tablet; Refill: 6  6. Need for immunization against influenza Discussed and administered

## 2020-12-02 LAB — LIPID PANEL WITH LDL/HDL RATIO
Cholesterol, Total: 167 mg/dL (ref 100–199)
HDL: 64 mg/dL (ref >39)
LDL Chol Calc (NIH): 81 mg/dL (ref 0–99)
LDL/HDL Ratio: 1.3 ratio (ref 0.0–3.6)
Triglycerides: 126 mg/dL (ref 0–149)
VLDL Cholesterol Cal: 22 mg/dL (ref 5–40)

## 2020-12-02 LAB — RENAL FUNCTION PANEL
Albumin: 4.8 g/dL (ref 3.8–4.9)
BUN/Creatinine Ratio: 11 (ref 9–20)
BUN: 11 mg/dL (ref 6–24)
CO2: 26 mmol/L (ref 20–29)
Calcium: 10 mg/dL (ref 8.7–10.2)
Chloride: 102 mmol/L (ref 96–106)
Creatinine, Ser: 0.97 mg/dL (ref 0.76–1.27)
Glucose: 97 mg/dL (ref 70–99)
Phosphorus: 3.8 mg/dL (ref 2.8–4.1)
Potassium: 4.6 mmol/L (ref 3.5–5.2)
Sodium: 140 mmol/L (ref 134–144)
eGFR: 92 mL/min/{1.73_m2} (ref >59)

## 2021-01-15 ENCOUNTER — Other Ambulatory Visit: Payer: Self-pay | Admitting: Family Medicine

## 2021-01-15 DIAGNOSIS — M109 Gout, unspecified: Secondary | ICD-10-CM

## 2021-01-15 DIAGNOSIS — I10 Essential (primary) hypertension: Secondary | ICD-10-CM

## 2021-01-16 NOTE — Telephone Encounter (Signed)
last RF 12/01/20 #180 1 RF  Too soon

## 2021-04-23 ENCOUNTER — Other Ambulatory Visit: Payer: Self-pay | Admitting: Nurse Practitioner

## 2021-04-23 DIAGNOSIS — E785 Hyperlipidemia, unspecified: Secondary | ICD-10-CM

## 2021-05-10 ENCOUNTER — Telehealth: Payer: Self-pay | Admitting: Family Medicine

## 2021-05-10 NOTE — Telephone Encounter (Signed)
Medication Refill - Medication: Pantoprazole 40 mg  Pt says pharmacy told him they need a PA for his refill ? ?Has the patient contacted their pharmacy? Yes.   ?(Agent: If no, request that the patient contact the pharmacy for the refill. If patient does not wish to contact the pharmacy document the reason why and proceed with request.) ?(Agent: If yes, when and what did the pharmacy advise?) ? ?Preferred Pharmacy (with phone number or street name): [facility]  ?Has the patient been seen for an appointment in the last year OR does the patient have an upcoming appointment? yes ? ?Agent: Please be advised that RX refills may take up to 3 business days. We ask that you follow-up with your pharmacy.  ?

## 2021-05-11 DIAGNOSIS — K2970 Gastritis, unspecified, without bleeding: Secondary | ICD-10-CM | POA: Insufficient documentation

## 2021-06-01 ENCOUNTER — Encounter: Payer: Self-pay | Admitting: Family Medicine

## 2021-06-01 ENCOUNTER — Ambulatory Visit: Payer: BC Managed Care – PPO | Admitting: Family Medicine

## 2021-06-01 VITALS — BP 130/80 | HR 80 | Ht 74.0 in | Wt 249.0 lb

## 2021-06-01 DIAGNOSIS — Z23 Encounter for immunization: Secondary | ICD-10-CM

## 2021-06-01 DIAGNOSIS — Z6831 Body mass index (BMI) 31.0-31.9, adult: Secondary | ICD-10-CM

## 2021-06-01 DIAGNOSIS — I10 Essential (primary) hypertension: Secondary | ICD-10-CM | POA: Diagnosis not present

## 2021-06-01 DIAGNOSIS — K219 Gastro-esophageal reflux disease without esophagitis: Secondary | ICD-10-CM | POA: Diagnosis not present

## 2021-06-01 DIAGNOSIS — N529 Male erectile dysfunction, unspecified: Secondary | ICD-10-CM

## 2021-06-01 DIAGNOSIS — T753XXA Motion sickness, initial encounter: Secondary | ICD-10-CM | POA: Diagnosis not present

## 2021-06-01 MED ORDER — SCOPOLAMINE 1 MG/3DAYS TD PT72: 1.0000 | MEDICATED_PATCH | TRANSDERMAL | 0 refills | Status: DC

## 2021-06-01 MED ORDER — CARVEDILOL 6.25 MG PO TABS: 6.2500 mg | ORAL_TABLET | Freq: Two times a day (BID) | ORAL | 1 refills | Status: DC

## 2021-06-01 MED ORDER — SILDENAFIL CITRATE 100 MG PO TABS: 100.0000 mg | ORAL_TABLET | Freq: Every day | ORAL | 6 refills | Status: DC | PRN

## 2021-06-01 MED ORDER — PANTOPRAZOLE SODIUM 40 MG PO TBEC: 40.0000 mg | DELAYED_RELEASE_TABLET | Freq: Every day | ORAL | 1 refills | Status: DC

## 2021-06-01 NOTE — Progress Notes (Signed)
? ? ?Date:  06/01/2021  ? ?Name:  Rodney Briggs   DOB:  21-Nov-1965   MRN:  [id number] ? ? ?Chief Complaint: Erectile Dysfunction, Hypertension, Gastroesophageal Reflux, motion sickness (Refill patches), and shingles vacc  ? ?Erectile Dysfunction ?This is a chronic problem. The current episode started more than 1 year ago. The problem has been gradually improving since onset. The nature of his difficulty is achieving erection. He reports no anxiety. Irritative symptoms do not include frequency or urgency. Obstructive symptoms do not include dribbling, incomplete emptying, an intermittent stream, a slower stream, straining or a weak stream. Pertinent negatives include no chills, dysuria or hematuria. Nothing aggravates the symptoms. Past treatments include sildenafil. The treatment provided moderate relief. He has had no adverse reactions caused by medications.  ?Hypertension ?This is a chronic problem. The current episode started more than 1 year ago. The problem has been gradually improving since onset. The problem is controlled. Pertinent negatives include no anxiety, blurred vision, chest pain, headaches, neck pain, palpitations or shortness of breath. There are no associated agents to hypertension. Past treatments include beta blockers and alpha 1 blockers. The current treatment provides moderate improvement. There are no compliance problems.  There is no history of angina, kidney disease, CAD/MI, CVA, heart failure, left ventricular hypertrophy, PVD or retinopathy. There is no history of chronic renal disease, a hypertension causing med or renovascular disease.  ?Gastroesophageal Reflux ?He reports no abdominal pain, no chest pain, no coughing, no dysphagia, no nausea, no sore throat or no wheezing. This is a chronic problem. The current episode started more than 1 year ago. The symptoms are aggravated by certain foods.  ? ?Lab Results  ?Component Value Date  ? NA 140 12/01/2020  ? K 4.6 12/01/2020  ? CO2 26  12/01/2020  ? GLUCOSE 97 12/01/2020  ? BUN 11 12/01/2020  ? CREATININE 0.97 12/01/2020  ? CALCIUM 10.0 12/01/2020  ? EGFR 92 12/01/2020  ? GFRNONAA 80 12/03/2019  ? ?Lab Results  ?Component Value Date  ? CHOL 167 12/01/2020  ? HDL 64 12/01/2020  ? [place] 81 12/01/2020  ? TRIG 126 12/01/2020  ? CHOLHDL 2.6 04/15/2020  ? ?No results found for: TSH ?Lab Results  ?Component Value Date  ? HGBA1C 5.6 12/13/2019  ? ?Lab Results  ?Component Value Date  ? WBC 7.1 12/05/2018  ? HGB 14.7 12/05/2018  ? HCT 43.3 12/05/2018  ? MCV 88 12/05/2018  ? PLT 209 12/05/2018  ? ?Lab Results  ?Component Value Date  ? ALT 20 04/15/2020  ? AST 24 04/15/2020  ? ALKPHOS 57 04/15/2020  ? BILITOT 1.0 04/15/2020  ? ?No results found for: 25OHVITD2, [place], VD25OH  ? ?Review of Systems  ?Constitutional:  Negative for chills and fever.  ?HENT:  Negative for drooling, ear discharge, ear pain and sore throat.   ?Eyes:  Negative for blurred vision.  ?Respiratory:  Negative for cough, shortness of breath and wheezing.   ?Cardiovascular:  Negative for chest pain, palpitations and leg swelling.  ?Gastrointestinal:  Negative for abdominal pain, blood in stool, constipation, diarrhea, dysphagia and nausea.  ?Endocrine: Negative for polydipsia.  ?Genitourinary:  Negative for dysuria, frequency, hematuria, incomplete emptying and urgency.  ?Musculoskeletal:  Negative for back pain, myalgias and neck pain.  ?Skin:  Negative for rash.  ?Allergic/Immunologic: Negative for environmental allergies.  ?Neurological:  Negative for dizziness and headaches.  ?Hematological:  Does not bruise/bleed easily.  ?Psychiatric/Behavioral:  Negative for suicidal ideas. The patient is not nervous/anxious.   ? ?  Patient Active Problem List  ? Diagnosis Date Noted  ? Gastritis 05/11/2021  ? Special screening for malignant neoplasms, colon   ? Polyp of ascending colon   ? Essential hypertension 12/13/2018  ? Elevated blood pressure reading 12/13/2018  ? Pain in both upper  extremities 06/01/2018  ? Hyperlipidemia LDL goal <70 01/18/2017  ? Abnormal nuclear stress test 08/19/2015  ? Coronary artery disease involving native coronary artery of native heart without angina pectoris 08/19/2015  ? Mild mitral regurgitation   ? ? ?Allergies  ?Allergen Reactions  ? Meloxicam Other (See Comments)  ?  Rectal bleeding  ? ? ?Past Surgical History:  ?Procedure Laterality Date  ? CARDIAC CATHETERIZATION Left 08/19/2015  ? Procedure: Left Heart Cath and Coronary Angiography;  Surgeon: Leonie Man, MD;  Location: [facility];  Service: Cardiovascular;  Laterality: Left;  ? CARDIAC CATHETERIZATION N/A 08/19/2015  ? Procedure: Coronary Stent Intervention;  Surgeon: Leonie Man, MD;  Location: [facility];  Service: Cardiovascular;  Laterality: N/A;  ? COLONOSCOPY WITH PROPOFOL N/A 07/06/2020  ? Procedure: COLONOSCOPY WITH BIOPSY;  Surgeon: [name], MD;  Location: [facility];  Service: Endoscopy;  Laterality: N/A;  ? POLYPECTOMY N/A 07/06/2020  ? Procedure: POLYPECTOMY;  Surgeon: [name], MD;  Location: [facility];  Service: Endoscopy;  Laterality: N/A;  ? ? ?Social History  ? ?Tobacco Use  ? Smoking status: Never  ? Smokeless tobacco: Never  ?Vaping Use  ? Vaping Use: Never used  ?Substance Use Topics  ? Alcohol use: Yes  ?  Alcohol/week: 24.0 standard drinks  ?  Types: 24 Cans of beer per week  ?  Comment: weekly  ? Drug use: No  ? ? ? ?Medication list has been reviewed and updated. ? ?Current Meds  ?Medication Sig  ? atorvastatin (LIPITOR) 80 MG tablet TAKE 1 TABLET BY MOUTH EVERY DAY  ? carvedilol (COREG) 6.25 MG tablet Take 1 tablet (6.25 mg total) by mouth 2 (two) times daily with a meal.  ? [facility] ASPIRIN ADULT LOW DOSE 81 MG chewable tablet CHEW 1 TABLET BY MOUTH EVERY DAY  ? ezetimibe (ZETIA) 10 MG tablet TAKE 1 TABLET BY MOUTH EVERY DAY  ? indomethacin (INDOCIN) 25 MG capsule TAKE 1 CAPSULE (25 MG TOTAL) BY MOUTH 2 (TWO) TIMES DAILY WITH A MEAL.  ?  pantoprazole (PROTONIX) 40 MG tablet Take 1 tablet (40 mg total) by mouth daily.  ? scopolamine (TRANSDERM-SCOP, 1.5 MG,) 1 MG/3DAYS Place 1 patch (1.5 mg total) onto the skin every 3 (three) days.  ? sildenafil (VIAGRA) 100 MG tablet Take 1 tablet (100 mg total) by mouth daily as needed for erectile dysfunction.  ? ? ? ?  12/01/2020  ?  8:25 AM 05/27/2020  ?  8:11 AM 12/03/2019  ?  8:03 AM 10/31/2019  ? 10:27 AM  ?GAD 7 : Generalized Anxiety Score  ?Nervous, Anxious, on Edge 0 0 0 0  ?Control/stop worrying 0 0 0 0  ?Worry too much - different things 0 0 0 0  ?Trouble relaxing 0 0 0 0  ?Restless 0 0 0 0  ?Easily annoyed or irritable 0 0 0 0  ?Afraid - awful might happen 0 0 0 0  ?Total GAD 7 Score 0 0 0 0  ? ? ? ?  12/01/2020  ?  8:25 AM  ?Depression screen PHQ 2/9  ?Decreased Interest 0  ?Down, Depressed, Hopeless 0  ?PHQ - 2 Score 0  ?Altered sleeping  0  ?Tired, decreased energy 0  ?Change in appetite 0  ?Feeling bad or failure about yourself  0  ?Trouble concentrating 0  ?Moving slowly or fidgety/restless 0  ?Suicidal thoughts 0  ?PHQ-9 Score 0  ? ? ?BP Readings from Last 3 Encounters:  ?06/01/21 130/80  ?12/01/20 130/82  ?07/15/20 120/84  ? ? ?Physical Exam ?Vitals and nursing note reviewed.  ?HENT:  ?   Head: Normocephalic.  ?   Right Ear: Tympanic membrane, ear canal and external ear normal.  ?   Left Ear: Tympanic membrane, ear canal and external ear normal.  ?   Nose: Nose normal.  ?Eyes:  ?   General: No scleral icterus.    ?   Right eye: No discharge.     ?   Left eye: No discharge.  ?   Conjunctiva/sclera: Conjunctivae normal.  ?   Pupils: Pupils are equal, round, and reactive to light.  ?Neck:  ?   Thyroid: No thyromegaly.  ?   Vascular: No JVD.  ?   Trachea: No tracheal deviation.  ?Cardiovascular:  ?   Rate and Rhythm: Normal rate and regular rhythm.  ?   Heart sounds: Normal heart sounds. No murmur heard. ?  No friction rub. No gallop.  ?Pulmonary:  ?   Effort: No respiratory distress.  ?   Breath sounds:  Normal breath sounds. No wheezing or rales.  ?Abdominal:  ?   General: Bowel sounds are normal.  ?   Palpations: Abdomen is soft. There is no mass.  ?   Tenderness: There is no abdominal tenderness. There is

## 2021-06-01 NOTE — Patient Instructions (Signed)
..  mmc

## 2021-08-11 NOTE — Progress Notes (Unsigned)
Cardiology Office Note    Date:  08/11/2021   ID:  Rodney Briggs, DOB Aug 03, 1965, MRN [id number]  PCP:  Rodney Patch, MD  Cardiologist:  Nelva Bush, MD  Electrophysiologist:  None   Chief Complaint: Follow-up  History of Present Illness:   Rodney Briggs is a 56 y.o. male with history of CAD s/p PCI to [place] in 07/2015, mild mitral regurgitation, and GERD who presents for follow up of his CAD.    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 Overall, this was an intermediate risk study.  Subsequent diagnotic LHC on 08/19/2015 that showed an occluded OM3 which was treated successfully with PCI/DES, along with 40% ramus stenosis, normal EF and LVEDP.  Echo at that time showed an EF of 60-65%, no RWMA, normal LV diastolic function, mild mitral regurgitation, mildly dilated left atrium, normal RVSF and PASP.  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 There were no EKG changes consistent with ischemia.  He exercised for 8 minutes and 37 seconds, achieving 10.1 METs.  Overall, this was a low risk study.  He was last seen in the office in 06/2020 and was doing well from a cardiac perspective, without symptoms of angina or decompensation.  ***   Labs independently reviewed: 11/2020 - BUN 11, serum creatinine 0.97, potassium 4.6, albumin 4.8, TC 167, TG 126, HDL 64, LDL 22 03/2020 - AST/ALT normal 11/2019 - A1c 5.6  Past Medical History:  Diagnosis Date   CAD S/P percutaneous coronary angioplasty 08/19/2015   a. 08/14/15 Ex MV: med defect of moderate sev- basal inf, basal, inferolat,  mid inf, and mid inferolat wall. Findings c/w prior MI in LCx dist w/ mild per-infarct ischemia. EF 43%, intermediate risk; b. 08/19/15 Cath/PCI: occluded OM3 s/p PCI/DES with Xience DES 2.25 mm x 18 mm, ramus 40%, inferolateral wall HK c/w occluded OM branch, nl EF, nl LVEDP; c. 06/2019 ETT: 8:37, 10.1 mets, no ST/T changes.   GERD (gastroesophageal reflux disease)    Gout    Hyperlipidemia LDL goal <70    Mild mitral regurgitation    a. echo 08/14/15: EF 60-65%, no RWMA, LV dia fxn nl, mild MR, LA mildly dilated, RV sys fxn nl, PASP nl    Past Surgical History:  Procedure Laterality Date   CARDIAC CATHETERIZATION Left 08/19/2015   Procedure: Left Heart Cath and Coronary Angiography;  Surgeon: Leonie Man, MD;  Location: [facility];  Service: Cardiovascular;  Laterality: Left;   CARDIAC CATHETERIZATION N/A 08/19/2015   Procedure: Coronary Stent Intervention;  Surgeon: Leonie Man, MD;  Location: [facility];  Service: Cardiovascular;  Laterality: N/A;   COLONOSCOPY WITH PROPOFOL N/A 07/06/2020   Procedure: COLONOSCOPY WITH BIOPSY;  Surgeon: [name], MD;  Location: [facility];  Service: Endoscopy;  Laterality: N/A;   POLYPECTOMY N/A 07/06/2020   Procedure: POLYPECTOMY;  Surgeon: [name], MD;  Location: [facility];  Service: Endoscopy;  Laterality: N/A;    Current Medications: No outpatient medications have been marked as taking for the 08/13/21 encounter (Appointment) with Rise Mu, PA-C.  Allergies:   Meloxicam   Social History   Socioeconomic History   Marital status: Divorced    Spouse name: Not on file   Number of children: Not on file   Years of education: Not on file   Highest education level: Not on file  Occupational History   Not on file  Tobacco Use   Smoking status: Never   Smokeless tobacco: Never  Vaping Use   Vaping Use: Never used  Substance and Sexual Activity   Alcohol use: Yes    Alcohol/week: 24.0  standard drinks of alcohol    Types: 24 Cans of beer per week    Comment: weekly   Drug use: No   Sexual activity: Yes  Other Topics Concern   Not on file  Social History Narrative   Not on file   Social Determinants of Health   Financial Resource Strain: Not on file  Food Insecurity: Not on file  Transportation Needs: Not on file  Physical Activity: Not on file  Stress: Not on file  Social Connections: Not on file     Family History:  The patient's family history includes Heart disease in his father and mother; Stroke in his mother.  ROS:   ROS   EKGs/Labs/Other Studies Reviewed:    Studies reviewed were summarized above. The additional studies were reviewed today:  2D echo 07/2015: - Left ventricle: The cavity size was normal. Systolic function was    normal. The estimated ejection fraction was in the range of 60%    to 65%. Wall motion was normal; there were no regional wall    motion abnormalities. Left ventricular diastolic function    parameters were normal.  - Mitral valve: There was mild regurgitation.  - Left atrium: The atrium was mildly dilated.  - Right ventricle: Systolic function was normal.  - Pulmonary arteries: Systolic pressure was within the normal    range. __________   [facility] 07/2015: Ost 3rd Mrg to 3rd Mrg lesion, 100% stenosed - likely subacute/borderline chronic but with evidence of old clot. Post intervention with Xience DES 2.25 mm 18 mm (2.45 mm), there is a 0% residual stenosis. Ramus lesion, 40% stenosed. The left ventricular systolic function is normal with regional wall motion abnormality consistent with inferolateral OM branch occlusion normal LVEDP   Very accurate findings on Myoview stress test single vessel CAD in the major lateral OM3  Branch. Successful percutaneous intervention of the OM 3 with a single DES stent.   Plan: Overnight monitoring on telemetry floor with TR band removal per protocol Add statin DAPT for minimum of 3  months, can then stop aspirin and continue Brilinta.  Would be acceptable to convert to Plavix after one month Continue other risk factor modification. The patient is a relatively labor-intensive/physical job. Nasal counseled on low stress activities with no use of the right arm/wrist over the weekend. __________   ETT 07/02/2019: Blood pressure demonstrated a normal response to exercise. There was no ST segment deviation noted during stress. No T wave inversion was noted during stress. Overall, the patient's exercise capacity was normal. [facility] Treadmill Score: low risk   Negative stress test without evidence of ischemia at given workload.   EKG:  EKG is ordered today.  The EKG ordered today demonstrates ***  Recent Labs: 12/01/2020: BUN 11; Creatinine, Ser 0.97; Potassium 4.6; Sodium 140  Recent Lipid Panel    Component Value Date/Time   CHOL 167 12/01/2020 0903   TRIG 126 12/01/2020 0903  HDL 64 12/01/2020 0903   CHOLHDL 2.6 04/15/2020 0754   VLDL 24 04/15/2020 0754   LDLCALC 81 12/01/2020 0903    PHYSICAL EXAM:    VS:  There were no vitals taken for this visit.  BMI: There is no height or weight on file to calculate BMI.  Physical Exam  Wt Readings from Last 3 Encounters:  06/01/21 249 lb (112.9 kg)  12/01/20 241 lb (109.3 kg)  07/15/20 235 lb (106.6 kg)     ASSESSMENT & PLAN:   CAD involving the native coronary arteries without***angina:  HTN: Blood pressure  HLD: LDL 22 in 11/2020   {Are you ordering a CV Procedure (e.g. stress test, cath, DCCV, TEE, etc)?   Press F2        :[id number]}     Disposition: F/u with Dr. Saunders Revel or an APP in ***.   Medication Adjustments/Labs and Tests Ordered: Current medicines are reviewed at length with the patient today.  Concerns regarding medicines are outlined above. Medication changes, Labs and Tests ordered today are summarized above and listed in the Patient Instructions accessible in Encounters.   Signed, Christell Faith,  PA-C 08/11/2021 8:11 AM     [address] [place], [place] [id number] [phone]

## 2021-08-13 ENCOUNTER — Encounter: Payer: Self-pay | Admitting: Physician Assistant

## 2021-08-13 ENCOUNTER — Ambulatory Visit: Payer: BC Managed Care – PPO | Admitting: Physician Assistant

## 2021-08-13 VITALS — BP 138/82 | HR 61 | Ht 74.0 in | Wt 254.0 lb

## 2021-08-13 DIAGNOSIS — I10 Essential (primary) hypertension: Secondary | ICD-10-CM | POA: Diagnosis not present

## 2021-08-13 DIAGNOSIS — I2510 Atherosclerotic heart disease of native coronary artery without angina pectoris: Secondary | ICD-10-CM | POA: Diagnosis not present

## 2021-08-13 DIAGNOSIS — E785 Hyperlipidemia, unspecified: Secondary | ICD-10-CM

## 2021-08-30 ENCOUNTER — Ambulatory Visit: Payer: BC Managed Care – PPO | Admitting: Family Medicine

## 2021-08-30 ENCOUNTER — Encounter: Payer: Self-pay | Admitting: Family Medicine

## 2021-08-30 VITALS — BP 120/80 | HR 80 | Ht 74.0 in | Wt 251.0 lb

## 2021-08-30 DIAGNOSIS — Z23 Encounter for immunization: Secondary | ICD-10-CM

## 2021-08-30 DIAGNOSIS — M109 Gout, unspecified: Secondary | ICD-10-CM | POA: Diagnosis not present

## 2021-08-30 DIAGNOSIS — M5116 Intervertebral disc disorders with radiculopathy, lumbar region: Secondary | ICD-10-CM

## 2021-08-30 MED ORDER — TRAMADOL HCL 50 MG PO TABS: 50.0000 mg | ORAL_TABLET | Freq: Three times a day (TID) | ORAL | 0 refills | Status: DC | PRN

## 2021-08-30 MED ORDER — TRAMADOL HCL 50 MG PO TABS: 50.0000 mg | ORAL_TABLET | Freq: Three times a day (TID) | ORAL | 0 refills | Status: AC | PRN

## 2021-08-30 MED ORDER — INDOMETHACIN 25 MG PO CAPS: 25.0000 mg | ORAL_CAPSULE | Freq: Two times a day (BID) | ORAL | 0 refills | Status: DC

## 2021-08-30 MED ORDER — PREDNISONE 10 MG PO TABS: ORAL_TABLET | ORAL | 0 refills | Status: DC

## 2021-08-30 MED ORDER — CYCLOBENZAPRINE HCL 10 MG PO TABS: 10.0000 mg | ORAL_TABLET | Freq: Three times a day (TID) | ORAL | 0 refills | Status: DC | PRN

## 2021-08-30 NOTE — Addendum Note (Signed)
Addended by: Juline Patch on: 08/30/2021 01:17 PM   Modules accepted: Orders

## 2021-08-30 NOTE — Progress Notes (Signed)
Date:  08/30/2021   Name:  Rodney Briggs   DOB:  07/21/1965   MRN:  [id number]   Chief Complaint: Back Pain (Grabbed trailer and can't straighten back up and hurts to get up from sitting position.)  Back Pain This is a new problem. The current episode started yesterday. The problem occurs constantly. The problem has been waxing and waning since onset. The pain is present in the lumbar spine. The quality of the pain is described as aching. The pain does not radiate. The pain is at a severity of 7/10. The pain is moderate. The symptoms are aggravated by bending and standing. Pertinent negatives include no bladder incontinence, bowel incontinence, numbness, paresis, paresthesias or weakness. Risk factors include history of cancer.    Lab Results  Component Value Date   NA 140 12/01/2020   K 4.6 12/01/2020   CO2 26 12/01/2020   GLUCOSE 97 12/01/2020   BUN 11 12/01/2020   CREATININE 0.97 12/01/2020   CALCIUM 10.0 12/01/2020   EGFR 92 12/01/2020   GFRNONAA 80 12/03/2019   Lab Results  Component Value Date   CHOL 167 12/01/2020   HDL 64 12/01/2020   LDLCALC 81 12/01/2020   TRIG 126 12/01/2020   CHOLHDL 2.6 04/15/2020   No results found for: "TSH" Lab Results  Component Value Date   HGBA1C 5.6 12/13/2019   Lab Results  Component Value Date   WBC 7.1 12/05/2018   HGB 14.7 12/05/2018   HCT 43.3 12/05/2018   MCV 88 12/05/2018   PLT 209 12/05/2018   Lab Results  Component Value Date   ALT 20 04/15/2020   AST 24 04/15/2020   ALKPHOS 57 04/15/2020   BILITOT 1.0 04/15/2020   No results found for: "25OHVITD2", "25OHVITD3", "VD25OH"   Review of Systems  Gastrointestinal:  Negative for bowel incontinence.  Genitourinary:  Negative for bladder incontinence.  Musculoskeletal:  Positive for back pain. Negative for arthralgias, gait problem, joint swelling, myalgias, neck pain and neck stiffness.  Skin:  Negative for color change, pallor, rash and wound.  Neurological:   Negative for weakness, numbness and paresthesias.    Patient Active Problem List   Diagnosis Date Noted   Gastritis 05/11/2021   Special screening for malignant neoplasms, colon    Polyp of ascending colon    Essential hypertension 12/13/2018   Elevated blood pressure reading 12/13/2018   Pain in both upper extremities 06/01/2018   Hyperlipidemia LDL goal <70 01/18/2017   Abnormal nuclear stress test 08/19/2015   Coronary artery disease involving native coronary artery of native heart without angina pectoris 08/19/2015   Mild mitral regurgitation     Allergies  Allergen Reactions   Meloxicam Other (See Comments)    Rectal bleeding    Past Surgical History:  Procedure Laterality Date   CARDIAC CATHETERIZATION Left 08/19/2015   Procedure: Left Heart Cath and Coronary Angiography;  Surgeon: Leonie Man, MD;  Location: [place] [facility];  Service: Cardiovascular;  Laterality: Left;   CARDIAC CATHETERIZATION N/A 08/19/2015   Procedure: Coronary Stent Intervention;  Surgeon: Leonie Man, MD;  Location: [place] [facility];  Service: Cardiovascular;  Laterality: N/A;   COLONOSCOPY WITH PROPOFOL N/A 07/06/2020   Procedure: COLONOSCOPY WITH BIOPSY;  Surgeon: [name], MD;  Location: [place];  Service: Endoscopy;  Laterality: N/A;   POLYPECTOMY N/A 07/06/2020   Procedure: POLYPECTOMY;  Surgeon: [name], MD;  Location: [place];  Service: Endoscopy;  Laterality: N/A;  Social History   Tobacco Use   Smoking status: Never   Smokeless tobacco: Never  Vaping Use   Vaping Use: Never used  Substance Use Topics   Alcohol use: Yes    Alcohol/week: 24.0 standard drinks of alcohol    Types: 24 Cans of beer per week    Comment: weekly   Drug use: No     Medication list has been reviewed and updated.  Current Meds  Medication Sig   atorvastatin (LIPITOR) 80 MG tablet TAKE 1 TABLET BY MOUTH EVERY DAY   carvedilol (COREG) 6.25 MG tablet Take 1  tablet (6.25 mg total) by mouth 2 (two) times daily with a meal.   [facility] ASPIRIN ADULT LOW DOSE 81 MG chewable tablet CHEW 1 TABLET BY MOUTH EVERY DAY   ezetimibe (ZETIA) 10 MG tablet TAKE 1 TABLET BY MOUTH EVERY DAY   indomethacin (INDOCIN) 25 MG capsule TAKE 1 CAPSULE (25 MG TOTAL) BY MOUTH 2 (TWO) TIMES DAILY WITH A MEAL.   nitroGLYCERIN (NITROSTAT) 0.4 MG SL tablet Place 1 tablet (0.4 mg total) under the tongue every 5 (five) minutes as needed for chest pain.   pantoprazole (PROTONIX) 40 MG tablet Take 1 tablet (40 mg total) by mouth daily.   scopolamine (TRANSDERM-SCOP, 1.5 MG,) 1 MG/3DAYS Place 1 patch (1.5 mg total) onto the skin every 3 (three) days.   sildenafil (VIAGRA) 100 MG tablet Take 1 tablet (100 mg total) by mouth daily as needed for erectile dysfunction.       12/01/2020    8:25 AM 05/27/2020    8:11 AM 12/03/2019    8:03 AM 10/31/2019   10:27 AM  GAD 7 : Generalized Anxiety Score  Nervous, Anxious, on Edge 0 0 0 0  Control/stop worrying 0 0 0 0  Worry too much - different things 0 0 0 0  Trouble relaxing 0 0 0 0  Restless 0 0 0 0  Easily annoyed or irritable 0 0 0 0  Afraid - awful might happen 0 0 0 0  Total GAD 7 Score 0 0 0 0       12/01/2020    8:25 AM 05/27/2020    8:11 AM 12/03/2019    8:03 AM  Depression screen PHQ 2/9  Decreased Interest 0 0 0  Down, Depressed, Hopeless 0 0 0  PHQ - 2 Score 0 0 0  Altered sleeping 0 0 0  Tired, decreased energy 0 0 0  Change in appetite 0 0 0  Feeling bad or failure about yourself  0 0 0  Trouble concentrating 0 0 0  Moving slowly or fidgety/restless 0 0 0  Suicidal thoughts 0 0 0  PHQ-9 Score 0 0 0    BP Readings from Last 3 Encounters:  08/30/21 120/80  08/13/21 138/82  06/01/21 130/80    Physical Exam Vitals and nursing note reviewed.  HENT:     Head: Normocephalic.     Right Ear: Tympanic membrane and external ear normal.     Left Ear: Tympanic membrane and external ear normal.     Nose: Nose normal.      Mouth/Throat:     Mouth: Mucous membranes are moist.  Eyes:     General: No scleral icterus.       Right eye: No discharge.        Left eye: No discharge.     Conjunctiva/sclera: Conjunctivae normal.     Pupils: Pupils are equal, round, and reactive to light.  Neck:     Thyroid: No thyromegaly.     Vascular: No JVD.     Trachea: No tracheal deviation.  Cardiovascular:     Rate and Rhythm: Normal rate and regular rhythm.     Heart sounds: Normal heart sounds, S1 normal and S2 normal. No murmur heard.    No systolic murmur is present.     No diastolic murmur is present.     No friction rub. No gallop.  Pulmonary:     Effort: No respiratory distress.     Breath sounds: Normal breath sounds. No wheezing or rales.  Abdominal:     General: Bowel sounds are normal.     Palpations: Abdomen is soft. There is no mass.     Tenderness: There is no abdominal tenderness. There is no guarding or rebound.  Musculoskeletal:        General: No tenderness.     Cervical back: Normal range of motion and neck supple.     Lumbar back: Spasms present. No bony tenderness. Positive left straight leg raise test. Negative right straight leg raise test. No scoliosis.     Right lower leg: 1+ Pitting Edema present.     Left lower leg: 1+ Pitting Edema present.  Lymphadenopathy:     Cervical: No cervical adenopathy.  Skin:    General: Skin is warm.     Findings: No rash.  Neurological:     Mental Status: He is alert and oriented to person, place, and time.     Cranial Nerves: No cranial nerve deficit.     Deep Tendon Reflexes: Reflexes are normal and symmetric.     Wt Readings from Last 3 Encounters:  08/30/21 251 lb (113.9 kg)  08/13/21 254 lb (115.2 kg)  06/01/21 249 lb (112.9 kg)    BP 120/80   Pulse 80   Ht _0  (1.88 m)   Wt 251 lb (113.9 kg)   BMI 32.23 kg/m   Assessment and Plan:  1. Herniation of lumbar intervertebral disc with radiculopathy New onset.  Persistent.  Relatively  stable except for pain.  There is symptomatology suggesting radiculopathy and that it has pain across the lower back and a straight leg raise primarily of the left.  Suspicion is for a herniated disc and that patient was trying to lift off a trailer hitch was having experience pain shortly thereafter.  We will treat with prednisone taper starting at 60 mg, indomethacin 25 mg twice a day as he has tolerated it before, cyclobenzaprine 10 mg every 12 hours as needed muscle spasm primarily to be used at night however, and tramadol 50 mg 1 every 8 hours due to severe pain.  Discussion with patient that if pain was to continue her unresolvable with below measures our next step will be to involve sports medicine and to obtain an x-ray. - predniSONE (DELTASONE) 10 MG tablet; Taper 6,6,6,5,5,5,4,4,3,3,2,2,1,1  Dispense: 53 tablet; Refill: 0 - indomethacin (INDOCIN) 25 MG capsule; Take 1 capsule (25 mg total) by mouth 2 (two) times daily with a meal.  Dispense: 60 capsule; Refill: 0 - cyclobenzaprine (FLEXERIL) 10 MG tablet; Take 1 tablet (10 mg total) by mouth 3 (three) times daily as needed for muscle spasms.  Dispense: 30 tablet; Refill: 0 - traMADol (ULTRAM) 50 MG tablet; Take 1 tablet (50 mg total) by mouth every 8 (eight) hours as needed for up to 5 days.  Dispense: 15 tablet; Refill: 0  2. Need for shingles vaccine Discussed and administered -  Varicella-zoster vaccine IM (Shingrix)  3. Gout, unspecified cause, unspecified chronicity, unspecified site Patient has had issues with take meloxicam in the past and that it sounds like he had a GI bleed but has tolerated indomethacin.  We will only use indomethacin as are anti-inflammatory and use it sparingly and recent therefore has been given for the tramadol to be used for pain control.  Patient has been suggested to use Tylenol on a as needed basis. - indomethacin (INDOCIN) 25 MG capsule; Take 1 capsule (25 mg total) by mouth 2 (two) times daily with a meal.   Dispense: 60 capsule; Refill: 0

## 2021-08-31 ENCOUNTER — Ambulatory Visit: Payer: BC Managed Care – PPO

## 2021-09-26 DIAGNOSIS — M109 Gout, unspecified: Secondary | ICD-10-CM | POA: Diagnosis not present

## 2021-09-26 DIAGNOSIS — M79642 Pain in left hand: Secondary | ICD-10-CM | POA: Diagnosis not present

## 2021-09-26 DIAGNOSIS — I10 Essential (primary) hypertension: Secondary | ICD-10-CM | POA: Diagnosis not present

## 2021-09-26 DIAGNOSIS — M25532 Pain in left wrist: Secondary | ICD-10-CM | POA: Diagnosis not present

## 2021-12-01 ENCOUNTER — Encounter: Payer: Self-pay | Admitting: Family Medicine

## 2021-12-01 ENCOUNTER — Ambulatory Visit: Payer: BC Managed Care – PPO | Admitting: Family Medicine

## 2021-12-01 VITALS — BP 128/78 | HR 76 | Ht 74.0 in | Wt 244.0 lb

## 2021-12-01 DIAGNOSIS — Z23 Encounter for immunization: Secondary | ICD-10-CM

## 2021-12-01 DIAGNOSIS — M5116 Intervertebral disc disorders with radiculopathy, lumbar region: Secondary | ICD-10-CM

## 2021-12-01 DIAGNOSIS — M109 Gout, unspecified: Secondary | ICD-10-CM

## 2021-12-01 DIAGNOSIS — I10 Essential (primary) hypertension: Secondary | ICD-10-CM

## 2021-12-01 DIAGNOSIS — E785 Hyperlipidemia, unspecified: Secondary | ICD-10-CM

## 2021-12-01 DIAGNOSIS — K219 Gastro-esophageal reflux disease without esophagitis: Secondary | ICD-10-CM | POA: Diagnosis not present

## 2021-12-01 MED ORDER — INDOMETHACIN 25 MG PO CAPS: 25.0000 mg | ORAL_CAPSULE | Freq: Two times a day (BID) | ORAL | 5 refills | Status: DC

## 2021-12-01 MED ORDER — PANTOPRAZOLE SODIUM 40 MG PO TBEC: 40.0000 mg | DELAYED_RELEASE_TABLET | Freq: Every day | ORAL | 1 refills | Status: DC

## 2021-12-01 MED ORDER — CARVEDILOL 6.25 MG PO TABS: 6.2500 mg | ORAL_TABLET | Freq: Two times a day (BID) | ORAL | 1 refills | Status: DC

## 2021-12-01 NOTE — Progress Notes (Signed)
Date:  12/01/2021   Name:  Rodney Briggs   DOB:  January 07, 1966   MRN:  [id number]   Chief Complaint: Gastroesophageal Reflux, Hypertension, Gout, and Flu Vaccine  Gastroesophageal Reflux He reports no abdominal pain, no belching, no chest pain, no choking, no coughing, no dysphagia, no early satiety, no globus sensation, no heartburn, no hoarse voice, no nausea, no sore throat, no stridor, no tooth decay, no water brash or no wheezing. This is a chronic problem. The current episode started more than 1 year ago. The problem has been gradually improving. The symptoms are aggravated by certain foods. He has tried a PPI for the symptoms. The treatment provided moderate relief.  Hypertension This is a chronic problem. The current episode started more than 1 year ago. The problem has been gradually improving since onset. The problem is controlled. Pertinent negatives include no chest pain, headaches, neck pain, palpitations or shortness of breath. Past treatments include alpha 1 blockers and beta blockers. The current treatment provides moderate improvement. There are no compliance problems.  There is no history of chronic renal disease, a hypertension causing med or renovascular disease.    Lab Results  Component Value Date   NA 140 12/01/2020   K 4.6 12/01/2020   CO2 26 12/01/2020   GLUCOSE 97 12/01/2020   BUN 11 12/01/2020   CREATININE 0.97 12/01/2020   CALCIUM 10.0 12/01/2020   EGFR 92 12/01/2020   GFRNONAA 80 12/03/2019   Lab Results  Component Value Date   CHOL 167 12/01/2020   HDL 64 12/01/2020   LDLCALC 81 12/01/2020   TRIG 126 12/01/2020   CHOLHDL 2.6 04/15/2020   No results found for: "TSH" Lab Results  Component Value Date   HGBA1C 5.6 12/13/2019   Lab Results  Component Value Date   WBC 7.1 12/05/2018   HGB 14.7 12/05/2018   HCT 43.3 12/05/2018   MCV 88 12/05/2018   PLT 209 12/05/2018   Lab Results  Component Value Date   ALT 20 04/15/2020   AST 24 04/15/2020    ALKPHOS 57 04/15/2020   BILITOT 1.0 04/15/2020   No results found for: "25OHVITD2", "25OHVITD3", "VD25OH"   Review of Systems  Constitutional:  Negative for chills and fever.  HENT:  Negative for drooling, ear discharge, ear pain, hoarse voice and sore throat.   Respiratory:  Negative for cough, choking, shortness of breath and wheezing.   Cardiovascular:  Negative for chest pain, palpitations and leg swelling.  Gastrointestinal:  Negative for abdominal pain, blood in stool, constipation, diarrhea, dysphagia, heartburn and nausea.  Endocrine: Negative for polydipsia.  Genitourinary:  Negative for dysuria, frequency, hematuria and urgency.  Musculoskeletal:  Negative for back pain, myalgias and neck pain.  Skin:  Negative for rash.  Allergic/Immunologic: Negative for environmental allergies.  Neurological:  Negative for dizziness and headaches.  Hematological:  Does not bruise/bleed easily.  Psychiatric/Behavioral:  Negative for suicidal ideas. The patient is not nervous/anxious.     Patient Active Problem List   Diagnosis Date Noted   Gastritis 05/11/2021   Special screening for malignant neoplasms, colon    Polyp of ascending colon    Essential hypertension 12/13/2018   Elevated blood pressure reading 12/13/2018   Pain in both upper extremities 06/01/2018   Hyperlipidemia LDL goal <70 01/18/2017   Abnormal nuclear stress test 08/19/2015   Coronary artery disease involving native coronary artery of native heart without angina pectoris 08/19/2015   Mild mitral regurgitation     Allergies  Allergen Reactions   Meloxicam Other (See Comments)    Rectal bleeding    Past Surgical History:  Procedure Laterality Date   CARDIAC CATHETERIZATION Left 08/19/2015   Procedure: Left Heart Cath and Coronary Angiography;  Surgeon: Leonie Man, MD;  Location: [facility];  Service: Cardiovascular;  Laterality: Left;   CARDIAC CATHETERIZATION N/A 08/19/2015   Procedure:  Coronary Stent Intervention;  Surgeon: Leonie Man, MD;  Location: [facility] [facility];  Service: Cardiovascular;  Laterality: N/A;   COLONOSCOPY WITH PROPOFOL N/A 07/06/2020   Procedure: COLONOSCOPY WITH BIOPSY;  Surgeon: [name], MD;  Location: [place];  Service: Endoscopy;  Laterality: N/A;   POLYPECTOMY N/A 07/06/2020   Procedure: POLYPECTOMY;  Surgeon: [name], MD;  Location: [place];  Service: Endoscopy;  Laterality: N/A;    Social History   Tobacco Use   Smoking status: Never   Smokeless tobacco: Never  Vaping Use   Vaping Use: Never used  Substance Use Topics   Alcohol use: Yes    Alcohol/week: 24.0 standard drinks of alcohol    Types: 24 Cans of beer per week    Comment: weekly   Drug use: No     Medication list has been reviewed and updated.  Current Meds  Medication Sig   atorvastatin (LIPITOR) 80 MG tablet TAKE 1 TABLET BY MOUTH EVERY DAY   carvedilol (COREG) 6.25 MG tablet Take 1 tablet (6.25 mg total) by mouth 2 (two) times daily with a meal.   [facility] ASPIRIN ADULT LOW DOSE 81 MG chewable tablet CHEW 1 TABLET BY MOUTH EVERY DAY   ezetimibe (ZETIA) 10 MG tablet TAKE 1 TABLET BY MOUTH EVERY DAY   indomethacin (INDOCIN) 25 MG capsule Take 1 capsule (25 mg total) by mouth 2 (two) times daily with a meal.   nitroGLYCERIN (NITROSTAT) 0.4 MG SL tablet Place 1 tablet (0.4 mg total) under the tongue every 5 (five) minutes as needed for chest pain.   pantoprazole (PROTONIX) 40 MG tablet Take 1 tablet (40 mg total) by mouth daily.   scopolamine (TRANSDERM-SCOP, 1.5 MG,) 1 MG/3DAYS Place 1 patch (1.5 mg total) onto the skin every 3 (three) days.   sildenafil (VIAGRA) 100 MG tablet Take 1 tablet (100 mg total) by mouth daily as needed for erectile dysfunction.   [DISCONTINUED] cyclobenzaprine (FLEXERIL) 10 MG tablet Take 1 tablet (10 mg total) by mouth 3 (three) times daily as needed for muscle spasms.       12/01/2021    8:00 AM 12/01/2020     8:25 AM 05/27/2020    8:11 AM 12/03/2019    8:03 AM  GAD 7 : Generalized Anxiety Score  Nervous, Anxious, on Edge 0 0 0 0  Control/stop worrying 0 0 0 0  Worry too much - different things 0 0 0 0  Trouble relaxing 0 0 0 0  Restless 0 0 0 0  Easily annoyed or irritable 0 0 0 0  Afraid - awful might happen 0 0 0 0  Total GAD 7 Score 0 0 0 0  Anxiety Difficulty Not difficult at all          12/01/2021    8:00 AM 12/01/2020    8:25 AM 05/27/2020    8:11 AM  Depression screen PHQ 2/9  Decreased Interest 0 0 0  Down, Depressed, Hopeless 0 0 0  PHQ - 2 Score 0 0 0  Altered sleeping 0 0 0  Tired, decreased energy 0  0 0  Change in appetite 0 0 0  Feeling bad or failure about yourself  0 0 0  Trouble concentrating 0 0 0  Moving slowly or fidgety/restless 0 0 0  Suicidal thoughts 0 0 0  PHQ-9 Score 0 0 0  Difficult doing work/chores Not difficult at all      BP Readings from Last 3 Encounters:  12/01/21 128/78  08/30/21 120/80  08/13/21 138/82    Physical Exam Vitals and nursing note reviewed.  HENT:     Head: Normocephalic.     Right Ear: External ear normal.     Left Ear: External ear normal.     Nose: Nose normal.  Eyes:     General: No scleral icterus.       Right eye: No discharge.        Left eye: No discharge.     Conjunctiva/sclera: Conjunctivae normal.     Pupils: Pupils are equal, round, and reactive to light.  Neck:     Thyroid: No thyromegaly.     Vascular: No JVD.     Trachea: No tracheal deviation.  Cardiovascular:     Rate and Rhythm: Normal rate and regular rhythm.     Heart sounds: Normal heart sounds. No murmur heard.    No friction rub. No gallop.  Pulmonary:     Effort: No respiratory distress.     Breath sounds: Normal breath sounds. No wheezing or rales.  Abdominal:     General: Bowel sounds are normal.     Palpations: Abdomen is soft. There is no mass.     Tenderness: There is no abdominal tenderness. There is no guarding or rebound.   Musculoskeletal:        General: No tenderness. Normal range of motion.     Cervical back: Normal range of motion and neck supple.  Lymphadenopathy:     Cervical: No cervical adenopathy.  Skin:    General: Skin is warm.     Findings: No rash.  Neurological:     Mental Status: He is alert and oriented to person, place, and time.     Cranial Nerves: No cranial nerve deficit.     Wt Readings from Last 3 Encounters:  12/01/21 244 lb (110.7 kg)  08/30/21 251 lb (113.9 kg)  08/13/21 254 lb (115.2 kg)    BP 128/78   Pulse 76   Ht 6' 2" (1.88 m)   Wt 244 lb (110.7 kg)   BMI 31.33 kg/m   Assessment and Plan:  1. Essential hypertension Chronic.  Controlled.  Stable.  Blood pressure 128/78.  Asymptomatic.  Tolerating medication well.  Continue carvedilol 6.25 mg 1 twice a day.  We will recheck CMP for electrolytes and GFR. - carvedilol (COREG) 6.25 MG tablet; Take 1 tablet (6.25 mg total) by mouth 2 (two) times daily with a meal.  Dispense: 180 tablet; Refill: 1 - Comprehensive Metabolic Panel (CMET)  2. Herniation of lumbar intervertebral disc with radiculopathy History of herniated disc patient is controlling with indomethacin 25 mg 1 twice a day as needed for pain and arthritis - indomethacin (INDOCIN) 25 MG capsule; Take 1 capsule (25 mg total) by mouth 2 (two) times daily with a meal.  Dispense: 60 capsule; Refill: 5  3. Gout, unspecified cause, unspecified chronicity, unspecified site Chronic.  His use for an acute gout attack which he is able to tolerate as well. - indomethacin (INDOCIN) 25 MG capsule; Take 1 capsule (25 mg total) by mouth 2 (  two) times daily with a meal.  Dispense: 60 capsule; Refill: 5  4. Gastroesophageal reflux disease, unspecified whether esophagitis present Chronic.  Controlled.  Stable.  Continue pantoprazole 40 mg once a day. - pantoprazole (PROTONIX) 40 MG tablet; Take 1 tablet (40 mg total) by mouth daily.  Dispense: 90 tablet; Refill: 1  5.  Hyperlipidemia LDL goal <70 Chronic.  Controlled.  Stable.  Patient is currently taking Zetia for lipid control by Dr. Kathrene Bongo.  We will check lipid panel for current level of control. - Lipid Panel With LDL/HDL Ratio  6. Need for immunization against influenza Discussed and administered. - Flu Vaccine QUAD 7moIM (Fluarix, Fluzone & Alfiuria Quad PF)    DOtilio Miu MD

## 2021-12-02 ENCOUNTER — Ambulatory Visit: Payer: BC Managed Care – PPO | Admitting: Family Medicine

## 2021-12-02 LAB — COMPREHENSIVE METABOLIC PANEL
ALT: 18 IU/L (ref 0–44)
AST: 27 IU/L (ref 0–40)
Albumin/Globulin Ratio: 2.1 (ref 1.2–2.2)
Albumin: 4.6 g/dL (ref 3.8–4.9)
Alkaline Phosphatase: 75 IU/L (ref 44–121)
BUN/Creatinine Ratio: 11 (ref 9–20)
BUN: 11 mg/dL (ref 6–24)
Bilirubin Total: 0.8 mg/dL (ref 0.0–1.2)
CO2: 24 mmol/L (ref 20–29)
Calcium: 9.6 mg/dL (ref 8.7–10.2)
Chloride: 103 mmol/L (ref 96–106)
Creatinine, Ser: 1.01 mg/dL (ref 0.76–1.27)
Globulin, Total: 2.2 g/dL (ref 1.5–4.5)
Glucose: 88 mg/dL (ref 70–99)
Potassium: 4.8 mmol/L (ref 3.5–5.2)
Sodium: 141 mmol/L (ref 134–144)
Total Protein: 6.8 g/dL (ref 6.0–8.5)
eGFR: 87 mL/min/{1.73_m2} (ref >59)

## 2021-12-02 LAB — LIPID PANEL WITH LDL/HDL RATIO
Cholesterol, Total: 151 mg/dL (ref 100–199)
HDL: 67 mg/dL (ref >39)
LDL Chol Calc (NIH): 72 mg/dL (ref 0–99)
LDL/HDL Ratio: 1.1 ratio (ref 0.0–3.6)
Triglycerides: 61 mg/dL (ref 0–149)
VLDL Cholesterol Cal: 12 mg/dL (ref 5–40)

## 2022-03-03 ENCOUNTER — Telehealth (HOSPITAL_COMMUNITY): Payer: Self-pay | Admitting: *Deleted

## 2022-03-03 NOTE — Telephone Encounter (Signed)
Patient given detailed instructions per Stress Test Requisition Sheet for test on 03/09/2022 at 10:45.Patient Notified to arrive 30 minutes early, and that it is imperative to arrive on time for appointment to keep from having the test rescheduled.  Patient verbalized understanding. Rodney Briggs

## 2022-03-09 ENCOUNTER — Ambulatory Visit (HOSPITAL_COMMUNITY): Payer: BC Managed Care – PPO | Attending: Physician Assistant

## 2022-03-09 DIAGNOSIS — I2510 Atherosclerotic heart disease of native coronary artery without angina pectoris: Secondary | ICD-10-CM

## 2022-03-09 LAB — EXERCISE TOLERANCE TEST
Angina Index: 0
Base ST Depression (mm): 0 mm
Duke Treadmill Score: 7
Estimated workload: 8.9
Exercise duration (min): 7 min
Exercise duration (sec): 16 s
MPHR: 164 {beats}/min
Peak HR: 150 {beats}/min
Percent HR: 91 %
RPE: 17
Rest HR: 59 {beats}/min
ST Depression (mm): 0 mm

## 2022-03-10 ENCOUNTER — Telehealth: Payer: Self-pay | Admitting: *Deleted

## 2022-03-10 NOTE — Telephone Encounter (Signed)
-----  Message from Rise Mu, PA-C sent at 03/10/2022  7:26 AM EST ----- Please inform the patient his stress test is normal. 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

## 2022-03-10 NOTE — Telephone Encounter (Signed)
Left voicemail message to call back for review of results.  

## 2022-03-11 NOTE — Telephone Encounter (Signed)
Left voicemail message to call back for review of results.  

## 2022-03-17 NOTE — Telephone Encounter (Signed)
Left voicemail message to call back for review of results.  

## 2022-03-18 NOTE — Telephone Encounter (Signed)
The patient has been notified of the result and verbalized understanding.  All questions (if any) were answered. Janan Ridge, [place] 03/18/2022 10:12 AM

## 2022-04-03 ENCOUNTER — Other Ambulatory Visit: Payer: Self-pay | Admitting: Nurse Practitioner

## 2022-05-19 ENCOUNTER — Other Ambulatory Visit: Payer: Self-pay | Admitting: Family Medicine

## 2022-05-19 DIAGNOSIS — E785 Hyperlipidemia, unspecified: Secondary | ICD-10-CM

## 2022-05-19 NOTE — Telephone Encounter (Signed)
Requested medication (s) are due for refill today:   provider to review  Requested medication (s) are on the active medication list:   Yes  Future visit scheduled:   Yes   Last ordered: 04/23/2021 #90, 3 refills  Returned because this was prescribed by Dr. Sharolyn Douglas.  It's mentioned he is now on Zetia in the notes.     Requested Prescriptions  Pending Prescriptions Disp Refills   atorvastatin (LIPITOR) 80 MG tablet [Pharmacy Med Name: ATORVASTATIN 80 MG TABLET] 90 tablet 3    Sig: TAKE 1 TABLET BY MOUTH EVERY DAY     There is no refill protocol information for this order

## 2022-06-02 ENCOUNTER — Encounter: Payer: Self-pay | Admitting: Family Medicine

## 2022-06-02 ENCOUNTER — Ambulatory Visit: Payer: BC Managed Care – PPO | Admitting: Family Medicine

## 2022-06-02 VITALS — BP 128/70 | HR 55 | Ht 74.0 in | Wt 254.0 lb

## 2022-06-02 DIAGNOSIS — I10 Essential (primary) hypertension: Secondary | ICD-10-CM

## 2022-06-02 DIAGNOSIS — K219 Gastro-esophageal reflux disease without esophagitis: Secondary | ICD-10-CM

## 2022-06-02 DIAGNOSIS — E785 Hyperlipidemia, unspecified: Secondary | ICD-10-CM | POA: Diagnosis not present

## 2022-06-02 DIAGNOSIS — N529 Male erectile dysfunction, unspecified: Secondary | ICD-10-CM | POA: Diagnosis not present

## 2022-06-02 MED ORDER — SILDENAFIL CITRATE 100 MG PO TABS
100.0000 mg | ORAL_TABLET | Freq: Every day | ORAL | 6 refills | Status: DC | PRN
Start: 2022-06-02 — End: 2022-12-02

## 2022-06-02 MED ORDER — CARVEDILOL 6.25 MG PO TABS: 6.2500 mg | ORAL_TABLET | Freq: Two times a day (BID) | ORAL | 1 refills | Status: DC

## 2022-06-02 MED ORDER — ATORVASTATIN CALCIUM 80 MG PO TABS: 80.0000 mg | ORAL_TABLET | Freq: Every day | ORAL | 1 refills | Status: DC

## 2022-06-02 MED ORDER — PANTOPRAZOLE SODIUM 40 MG PO TBEC: 40.0000 mg | DELAYED_RELEASE_TABLET | Freq: Every day | ORAL | 1 refills | Status: DC

## 2022-06-02 MED ORDER — EZETIMIBE 10 MG PO TABS: 10.0000 mg | ORAL_TABLET | Freq: Every day | ORAL | 1 refills | Status: DC

## 2022-06-02 NOTE — Progress Notes (Signed)
Date:  06/02/2022   Name:  Rodney Briggs   DOB:  Jul 17, 1965   MRN:  [id number]   Chief Complaint: Hypertension, Hyperlipidemia, Gastroesophageal Reflux, and Erectile Dysfunction  Hypertension This is a chronic problem. The current episode started more than 1 year ago. The problem has been gradually improving since onset. The problem is controlled. Pertinent negatives include no chest pain, headaches, neck pain, orthopnea, palpitations, peripheral edema, PND or shortness of breath. There are no associated agents to hypertension. Risk factors for coronary artery disease include dyslipidemia. Past treatments include alpha 1 blockers and beta blockers. The current treatment provides moderate improvement. Hypertensive end-organ damage includes CAD/MI. There is no history of CVA. There is no history of chronic renal disease, a hypertension causing med or a thyroid problem.  Hyperlipidemia This is a chronic problem. The current episode started more than 1 year ago. The problem is controlled. He has no history of chronic renal disease or diabetes. Pertinent negatives include no chest pain, myalgias or shortness of breath. Current antihyperlipidemic treatment includes statins and ezetimibe. The current treatment provides moderate improvement of lipids. There are no compliance problems.  Risk factors for coronary artery disease include dyslipidemia and hypertension.  Gastroesophageal Reflux He complains of wheezing. He reports no abdominal pain, no chest pain, no coughing, no dysphagia, no heartburn, no nausea or no sore throat. This is a chronic problem. The current episode started more than 1 year ago. The problem has been gradually improving. The symptoms are aggravated by certain foods. He has tried a PPI for the symptoms. The treatment provided moderate relief.  Erectile Dysfunction This is a chronic problem. The current episode started more than 1 year ago. The problem has been gradually improving  since onset. The nature of his difficulty is achieving erection, maintaining erection and penetration. He reports no anxiety. Irritative symptoms do not include frequency, nocturia or urgency. Pertinent negatives include no chills, dysuria or hematuria. Past treatments include sildenafil. The treatment provided moderate relief.    Lab Results  Component Value Date   NA 141 12/01/2021   K 4.8 12/01/2021   CO2 24 12/01/2021   GLUCOSE 88 12/01/2021   BUN 11 12/01/2021   CREATININE 1.01 12/01/2021   CALCIUM 9.6 12/01/2021   EGFR 87 12/01/2021   GFRNONAA 80 12/03/2019   Lab Results  Component Value Date   CHOL 151 12/01/2021   HDL 67 12/01/2021   LDLCALC 72 12/01/2021   TRIG 61 12/01/2021   CHOLHDL 2.6 04/15/2020   No results found for: "TSH" Lab Results  Component Value Date   HGBA1C 5.6 12/13/2019   Lab Results  Component Value Date   WBC 7.1 12/05/2018   HGB 14.7 12/05/2018   HCT 43.3 12/05/2018   MCV 88 12/05/2018   PLT 209 12/05/2018   Lab Results  Component Value Date   ALT 18 12/01/2021   AST 27 12/01/2021   ALKPHOS 75 12/01/2021   BILITOT 0.8 12/01/2021   No results found for: "25OHVITD2", "25OHVITD3", "VD25OH"   Review of Systems  Constitutional:  Negative for chills and fever.  HENT:  Negative for drooling, ear discharge, ear pain and sore throat.   Respiratory:  Positive for wheezing. Negative for cough and shortness of breath.   Cardiovascular:  Negative for chest pain, palpitations, orthopnea, leg swelling and PND.  Gastrointestinal:  Negative for abdominal pain, blood in stool, constipation, diarrhea, dysphagia, heartburn and nausea.  Endocrine: Negative for polydipsia.  Genitourinary:  Negative for dysuria, frequency,  hematuria, nocturia and urgency.  Musculoskeletal:  Negative for back pain, myalgias and neck pain.  Skin:  Negative for rash.  Allergic/Immunologic: Negative for environmental allergies.  Neurological:  Negative for dizziness and  headaches.  Hematological:  Does not bruise/bleed easily.  Psychiatric/Behavioral:  Negative for suicidal ideas. The patient is not nervous/anxious.     Patient Active Problem List   Diagnosis Date Noted   Gastritis 05/11/2021   Special screening for malignant neoplasms, colon    Polyp of ascending colon    Essential hypertension 12/13/2018   Elevated blood pressure reading 12/13/2018   Pain in both upper extremities 06/01/2018   Hyperlipidemia LDL goal <70 01/18/2017   Abnormal nuclear stress test 08/19/2015   Coronary artery disease involving native coronary artery of native heart without angina pectoris 08/19/2015   Mild mitral regurgitation     Allergies  Allergen Reactions   Meloxicam Other (See Comments)    Rectal bleeding    Past Surgical History:  Procedure Laterality Date   CARDIAC CATHETERIZATION Left 08/19/2015   Procedure: Left Heart Cath and Coronary Angiography;  Surgeon: Marykay Lexavid W Harding, MD;  [facility];  Service: Cardiovascular;  Laterality: Left;   CARDIAC CATHETERIZATION N/A 08/19/2015   Procedure: Coronary Stent Intervention;  Surgeon: Marykay Lexavid W Harding, MD;  [facility];  Service: Cardiovascular;  Laterality: N/A;   COLONOSCOPY WITH PROPOFOL N/A 07/06/2020   Procedure: COLONOSCOPY WITH BIOPSY;  Surgeon: Midge MiniumWohl, Darren, MD;  [facility];  Service: Endoscopy;  Laterality: N/A;   POLYPECTOMY N/A 07/06/2020   Procedure: POLYPECTOMY;  Surgeon: Midge MiniumWohl, Darren, MD;  [facility];  Service: Endoscopy;  Laterality: N/A;    Social History   Tobacco Use   Smoking status: Never   Smokeless tobacco: Never  Vaping Use   Vaping Use: Never used  Substance Use Topics   Alcohol use: Yes    Alcohol/week: 24.0 standard drinks of alcohol    Types: 24 Cans of beer per week    Comment: weekly   Drug use: No     Medication list has been reviewed and updated.  Current Meds  Medication Sig    atorvastatin (LIPITOR) 80 MG tablet TAKE 1 TABLET BY MOUTH EVERY DAY   carvedilol (COREG) 6.25 MG tablet Take 1 tablet (6.25 mg total) by mouth 2 (two) times daily with a meal.   [facility] ASPIRIN ADULT LOW DOSE 81 MG chewable tablet CHEW 1 TABLET BY MOUTH EVERY DAY   ezetimibe (ZETIA) 10 MG tablet TAKE 1 TABLET BY MOUTH EVERY DAY   indomethacin (INDOCIN) 25 MG capsule Take 1 capsule (25 mg total) by mouth 2 (two) times daily with a meal.   nitroGLYCERIN (NITROSTAT) 0.4 MG SL tablet Place 1 tablet (0.4 mg total) under the tongue every 5 (five) minutes as needed for chest pain.   pantoprazole (PROTONIX) 40 MG tablet Take 1 tablet (40 mg total) by mouth daily.   sildenafil (VIAGRA) 100 MG tablet Take 1 tablet (100 mg total) by mouth daily as needed for erectile dysfunction.       06/02/2022    8:43 AM 12/01/2021    8:00 AM 12/01/2020    8:25 AM 05/27/2020    8:11 AM  GAD 7 : Generalized Anxiety Score  Nervous, Anxious, on Edge 0 0 0 0  Control/stop worrying 0 0 0 0  Worry too much - different things 0 0 0 0  Trouble relaxing 0 0 0 0  Restless 0  0 0 0  Easily annoyed or irritable 0 0 0 0  Afraid - awful might happen 0 0 0 0  Total GAD 7 Score 0 0 0 0  Anxiety Difficulty Not difficult at all Not difficult at all         06/02/2022    8:43 AM 12/01/2021    8:00 AM 12/01/2020    8:25 AM  Depression screen PHQ 2/9  Decreased Interest 0 0 0  Down, Depressed, Hopeless 0 0 0  PHQ - 2 Score 0 0 0  Altered sleeping 0 0 0  Tired, decreased energy 0 0 0  Change in appetite 0 0 0  Feeling bad or failure about yourself  0 0 0  Trouble concentrating 0 0 0  Moving slowly or fidgety/restless 0 0 0  Suicidal thoughts 0 0 0  PHQ-9 Score 0 0 0  Difficult doing work/chores Not difficult at all Not difficult at all     BP Readings from Last 3 Encounters:  06/02/22 128/70  12/01/21 128/78  08/30/21 120/80    Physical Exam Vitals and nursing note reviewed.  HENT:     Head: Normocephalic.      Right Ear: Tympanic membrane, ear canal and external ear normal.     Left Ear: Tympanic membrane, ear canal and external ear normal.     Nose: Nose normal. No congestion or rhinorrhea.     Mouth/Throat:     Mouth: Mucous membranes are moist.  Eyes:     General: No scleral icterus.       Right eye: No discharge.        Left eye: No discharge.     Conjunctiva/sclera: Conjunctivae normal.     Pupils: Pupils are equal, round, and reactive to light.  Neck:     Thyroid: No thyromegaly.     Vascular: No JVD.     Trachea: No tracheal deviation.  Cardiovascular:     Rate and Rhythm: Normal rate and regular rhythm.     Heart sounds: Normal heart sounds. No murmur heard.    No friction rub. No gallop.  Pulmonary:     Effort: No respiratory distress.     Breath sounds: Normal breath sounds. No wheezing, rhonchi or rales.  Abdominal:     General: Bowel sounds are normal.     Palpations: Abdomen is soft. There is no mass.     Tenderness: There is no abdominal tenderness. There is no guarding or rebound.  Musculoskeletal:        General: No tenderness. Normal range of motion.     Cervical back: Normal range of motion and neck supple.  Lymphadenopathy:     Cervical: No cervical adenopathy.  Skin:    General: Skin is warm.     Findings: No bruising, erythema or rash.  Neurological:     Mental Status: He is alert.     Wt Readings from Last 3 Encounters:  06/02/22 254 lb (115.2 kg)  12/01/21 244 lb (110.7 kg)  08/30/21 251 lb (113.9 kg)    BP 128/70   Pulse (!) 55   Ht 6\' 2"  (1.88 m)   Wt 254 lb (115.2 kg)   SpO2 97%   BMI 32.61 kg/m   Assessment and Plan:  1. Hyperlipidemia LDL goal <70 Chronic.  Controlled.  Stable.  Continue atorvastatin 80 mg once a day.  Patient with history of coronary artery disease and GERD achievement is to be below 70. - atorvastatin (LIPITOR) 80  MG tablet; Take 1 tablet (80 mg total) by mouth daily.  Dispense: 90 tablet; Refill: 1  2. Essential  hypertension .  Controlled.  Stable.  Blood pressure today 128/70.  Continue carvedilol 6.25 mg twice a day.  Will check renal function panel for current electrolytes and GFR.  I - carvedilol (COREG) 6.25 MG tablet; Take 1 tablet (6.25 mg total) by mouth 2 (two) times daily with a meal.  Dispense: 180 tablet; Refill: 1 - Renal Function Panel  3. Gastroesophageal reflux disease, unspecified whether esophagitis present Chronic.  Controlled.  Stable.  Continue pantoprazole 40 mg once a day. - pantoprazole (PROTONIX) 40 MG tablet; Take 1 tablet (40 mg total) by mouth daily.  Dispense: 90 tablet; Refill: 1  4. Vasculogenic erectile dysfunction, unspecified vasculogenic erectile dysfunction type Chronic.  Controlled.  Patient is having results with sildenafil 100 mg as needed.  Will refill for 6 months. - sildenafil (VIAGRA) 100 MG tablet; Take 1 tablet (100 mg total) by mouth daily as needed for erectile dysfunction.  Dispense: 15 tablet; Refill: 6    Elizabeth Sauer, MD

## 2022-06-03 LAB — RENAL FUNCTION PANEL
Albumin: 4.4 g/dL (ref 3.8–4.9)
BUN/Creatinine Ratio: 13 (ref 9–20)
BUN: 12 mg/dL (ref 6–24)
CO2: 20 mmol/L (ref 20–29)
Calcium: 9.4 mg/dL (ref 8.7–10.2)
Chloride: 104 mmol/L (ref 96–106)
Creatinine, Ser: 0.93 mg/dL (ref 0.76–1.27)
Glucose: 109 mg/dL — ABNORMAL HIGH (ref 70–99)
Phosphorus: 3.4 mg/dL (ref 2.8–4.1)
Potassium: 4.4 mmol/L (ref 3.5–5.2)
Sodium: 141 mmol/L (ref 134–144)
eGFR: 96 mL/min/{1.73_m2} (ref >59)

## 2022-06-14 ENCOUNTER — Other Ambulatory Visit: Payer: Self-pay | Admitting: Family Medicine

## 2022-06-14 DIAGNOSIS — M5116 Intervertebral disc disorders with radiculopathy, lumbar region: Secondary | ICD-10-CM

## 2022-06-14 DIAGNOSIS — M109 Gout, unspecified: Secondary | ICD-10-CM

## 2022-06-14 NOTE — Telephone Encounter (Signed)
Requested medication (s) are due for refill today: yes  Requested medication (s) are on the active medication list: yes    Last refill: 12/01/21 #60  5 refills  Future visit scheduled yes 12/02/22  Notes to clinic:Failed due to labs, please review. Thank you.  Requested Prescriptions  Pending Prescriptions Disp Refills   indomethacin (INDOCIN) 25 MG capsule [Pharmacy Med Name: INDOMETHACIN 25 MG CAPSULE] 60 capsule 5    Sig: Take 1 capsule (25 mg total) by mouth 2 (two) times daily with a meal.     Analgesics:  NSAIDS Failed - 06/14/2022  1:56 AM      Failed - Manual Review: Labs are only required if the patient has taken medication for more than 8 weeks.      Failed - HGB in normal range and within 360 days    Hemoglobin  Date Value Ref Range Status  12/05/2018 14.7 13.0 - 17.7 g/dL Final         Failed - PLT in normal range and within 360 days    Platelets  Date Value Ref Range Status  12/05/2018 209 150 - 450 x10E3/uL Final         Failed - HCT in normal range and within 360 days    Hematocrit  Date Value Ref Range Status  12/05/2018 43.3 37.5 - 51.0 % Final         Passed - Cr in normal range and within 360 days    Creatinine, Ser  Date Value Ref Range Status  06/02/2022 0.93 0.76 - 1.27 mg/dL Final         Passed - eGFR is 30 or above and within 360 days    GFR calc Af Amer  Date Value Ref Range Status  12/03/2019 93 >59 mL/min/1.73 Final    Comment:    **[facility] currently reports eGFR in compliance with the current**   recommendations of the [employer]. [facility] will   update reporting as new guidelines are published from the NKF-ASN   Task force.    GFR calc non Af Amer  Date Value Ref Range Status  12/03/2019 80 >59 mL/min/1.73 Final   eGFR  Date Value Ref Range Status  06/02/2022 96 >59 mL/min/1.73 Final         Passed - Patient is not pregnant      Passed - Valid encounter within last 12 months    Recent Outpatient Visits            1 week ago Essential hypertension   [facility]   6 months ago Essential hypertension   [facility] [facility]   9 months ago Herniation of lumbar intervertebral disc with radiculopathy   [place] [facility]   1 year ago Essential hypertension   [place] [facility]   1 year ago Essential hypertension    [facility]       Future Appointments             In 5 months Duanne Limerick, MD [facility]

## 2022-09-12 ENCOUNTER — Other Ambulatory Visit: Payer: Self-pay

## 2022-09-12 DIAGNOSIS — T753XXA Motion sickness, initial encounter: Secondary | ICD-10-CM

## 2022-09-12 MED ORDER — SCOPOLAMINE 1 MG/3DAYS TD PT72
1.0000 | MEDICATED_PATCH | TRANSDERMAL | 0 refills | Status: DC
Start: 2022-09-12 — End: 2023-03-27

## 2022-11-28 ENCOUNTER — Other Ambulatory Visit: Payer: Self-pay | Admitting: Family Medicine

## 2022-11-28 DIAGNOSIS — E785 Hyperlipidemia, unspecified: Secondary | ICD-10-CM

## 2022-12-02 ENCOUNTER — Encounter: Payer: Self-pay | Admitting: Family Medicine

## 2022-12-02 ENCOUNTER — Ambulatory Visit: Payer: BC Managed Care – PPO | Admitting: Family Medicine

## 2022-12-02 VITALS — BP 142/82 | HR 56 | Ht 74.0 in | Wt 261.0 lb

## 2022-12-02 DIAGNOSIS — N529 Male erectile dysfunction, unspecified: Secondary | ICD-10-CM

## 2022-12-02 DIAGNOSIS — M109 Gout, unspecified: Secondary | ICD-10-CM

## 2022-12-02 DIAGNOSIS — K219 Gastro-esophageal reflux disease without esophagitis: Secondary | ICD-10-CM

## 2022-12-02 DIAGNOSIS — I10 Essential (primary) hypertension: Secondary | ICD-10-CM

## 2022-12-02 DIAGNOSIS — E785 Hyperlipidemia, unspecified: Secondary | ICD-10-CM | POA: Diagnosis not present

## 2022-12-02 DIAGNOSIS — M5116 Intervertebral disc disorders with radiculopathy, lumbar region: Secondary | ICD-10-CM

## 2022-12-02 MED ORDER — INDOMETHACIN 25 MG PO CAPS
25.0000 mg | ORAL_CAPSULE | Freq: Two times a day (BID) | ORAL | 5 refills | Status: DC
Start: 2022-12-02 — End: 2023-06-11

## 2022-12-02 MED ORDER — CARVEDILOL 12.5 MG PO TABS
12.5000 mg | ORAL_TABLET | Freq: Two times a day (BID) | ORAL | 1 refills | Status: DC
Start: 2022-12-02 — End: 2022-12-29

## 2022-12-02 MED ORDER — SILDENAFIL CITRATE 100 MG PO TABS: 100.0000 mg | ORAL_TABLET | Freq: Every day | ORAL | 11 refills | Status: DC | PRN

## 2022-12-02 MED ORDER — ATORVASTATIN CALCIUM 80 MG PO TABS
80.0000 mg | ORAL_TABLET | Freq: Every day | ORAL | 1 refills | Status: DC
Start: 2022-12-02 — End: 2023-08-11

## 2022-12-02 MED ORDER — PANTOPRAZOLE SODIUM 40 MG PO TBEC
40.0000 mg | DELAYED_RELEASE_TABLET | Freq: Every day | ORAL | 1 refills | Status: DC
Start: 2022-12-02 — End: 2023-06-18

## 2022-12-02 MED ORDER — EZETIMIBE 10 MG PO TABS
10.0000 mg | ORAL_TABLET | Freq: Every day | ORAL | 1 refills | Status: DC
Start: 2022-12-02 — End: 2023-12-05

## 2022-12-02 MED ORDER — EZETIMIBE 10 MG PO TABS: 10.0000 mg | ORAL_TABLET | Freq: Every day | ORAL | 1 refills | Status: DC

## 2022-12-02 NOTE — Progress Notes (Signed)
Date:  12/02/2022   Name:  Rodney Briggs   DOB:  [birth year]   MRN:  [id number]   Chief Complaint: Follow-up  Hypertension This is a chronic problem. The current episode started more than 1 year ago. The problem has been gradually improving since onset. The problem is controlled. Pertinent negatives include no anxiety, blurred vision, chest pain, headaches, malaise/fatigue, neck pain, orthopnea, palpitations, peripheral edema, PND, shortness of breath or sweats. There are no associated agents to hypertension. There are no known risk factors for coronary artery disease. Past treatments include beta blockers and alpha 1 blockers. The current treatment provides moderate improvement. There are no compliance problems.  There is no history of angina, kidney disease, CAD/MI, CVA, heart failure, left ventricular hypertrophy, PVD or retinopathy. There is no history of chronic renal disease, a hypertension causing med or renovascular disease.  Hyperlipidemia This is a chronic problem. The current episode started more than 1 year ago. The problem is controlled. Recent lipid tests were reviewed and are normal. He has no history of chronic renal disease or diabetes. Pertinent negatives include no chest pain, focal sensory loss or shortness of breath. Current antihyperlipidemic treatment includes statins and ezetimibe. The current treatment provides moderate improvement of lipids. There are no compliance problems.   Gastroesophageal Reflux He reports no abdominal pain, no chest pain, no coughing or no wheezing. This is a chronic problem. The problem has been gradually improving. The symptoms are aggravated by certain foods. Pertinent negatives include no fatigue, melena, muscle weakness, orthopnea or weight loss. He has tried a PPI for the symptoms. The treatment provided moderate relief.  Erectile Dysfunction This is a chronic problem. The current episode started more than 1 year ago. The problem has been  gradually improving since onset. The nature of his difficulty is achieving erection. Obstructive symptoms do not include dribbling. Pertinent negatives include no chills, dysuria, genital pain, hematuria, hesitancy or inability to urinate. Past treatments include tadalafil. The treatment provided moderate relief.    Lab Results  Component Value Date   NA 141 06/02/2022   K 4.4 06/02/2022   CO2 20 06/02/2022   GLUCOSE 109 (H) 06/02/2022   BUN 12 06/02/2022   CREATININE 0.93 06/02/2022   CALCIUM 9.4 06/02/2022   EGFR 96 06/02/2022   GFRNONAA 80 12/03/2019   Lab Results  Component Value Date   CHOL 151 12/01/2021   HDL 67 12/01/2021   LDLCALC 72 12/01/2021   TRIG 61 12/01/2021   CHOLHDL 2.6 04/15/2020   No results found for: "TSH" Lab Results  Component Value Date   HGBA1C 5.6 12/13/2019   Lab Results  Component Value Date   WBC 7.1 12/05/2018   HGB 14.7 12/05/2018   HCT 43.3 12/05/2018   MCV 88 12/05/2018   PLT 209 12/05/2018   Lab Results  Component Value Date   ALT 18 12/01/2021   AST 27 12/01/2021   ALKPHOS 75 12/01/2021   BILITOT 0.8 12/01/2021   No results found for: "25OHVITD2", "25OHVITD3", "VD25OH"   Review of Systems  Constitutional:  Negative for chills, fatigue, malaise/fatigue and weight loss.  HENT:  Negative for trouble swallowing.   Eyes:  Negative for blurred vision and visual disturbance.  Respiratory:  Negative for cough, chest tightness, shortness of breath, wheezing and stridor.   Cardiovascular:  Negative for chest pain, palpitations, orthopnea, leg swelling and PND.  Gastrointestinal:  Negative for abdominal pain, blood in stool, constipation and melena.  Endocrine: Negative for polydipsia and  polyuria.  Genitourinary:  Negative for dysuria, hematuria and hesitancy.  Musculoskeletal:  Negative for muscle weakness and neck pain.  Neurological:  Negative for headaches.    Patient Active Problem List   Diagnosis Date Noted   Gastritis  05/11/2021   Special screening for malignant neoplasms, colon    Polyp of ascending colon    Essential hypertension 12/13/2018   Elevated blood pressure reading 12/13/2018   Pain in both upper extremities 06/01/2018   Hyperlipidemia LDL goal <70 01/18/2017   Abnormal nuclear stress test 08/19/2015   Coronary artery disease involving native coronary artery of native heart without angina pectoris 08/19/2015   Mild mitral regurgitation     Allergies  Allergen Reactions   Meloxicam Other (See Comments)    Rectal bleeding    Past Surgical History:  Procedure Laterality Date   CARDIAC CATHETERIZATION Left 08/19/2015   Procedure: Left Heart Cath and Coronary Angiography;  Surgeon: Marykay Lex, MD;  Location: [facility] INVASIVE CV LAB;  Service: Cardiovascular;  Laterality: Left;   CARDIAC CATHETERIZATION N/A 08/19/2015   Procedure: Coronary Stent Intervention;  Surgeon: Marykay Lex, MD;  Location: [facility] INVASIVE CV LAB;  Service: Cardiovascular;  Laterality: N/A;   COLONOSCOPY WITH PROPOFOL N/A 07/06/2020   Procedure: COLONOSCOPY WITH BIOPSY;  Surgeon: Midge Minium, MD;  Location: [facility];  Service: Endoscopy;  Laterality: N/A;   POLYPECTOMY N/A 07/06/2020   Procedure: POLYPECTOMY;  Surgeon: Midge Minium, MD;  Location: [facility];  Service: Endoscopy;  Laterality: N/A;    Social History   Tobacco Use   Smoking status: Never   Smokeless tobacco: Never  Vaping Use   Vaping status: Never Used  Substance Use Topics   Alcohol use: Yes    Alcohol/week: 24.0 standard drinks of alcohol    Types: 24 Cans of beer per week    Comment: weekly   Drug use: No     Medication list has been reviewed and updated.  Current Meds  Medication Sig   atorvastatin (LIPITOR) 80 MG tablet TAKE 1 TABLET BY MOUTH EVERY DAY   carvedilol (COREG) 6.25 MG tablet Take 1 tablet (6.25 mg total) by mouth 2 (two) times daily with a meal.   [facility] ASPIRIN ADULT LOW DOSE 81 MG chewable tablet  CHEW 1 TABLET BY MOUTH EVERY DAY   ezetimibe (ZETIA) 10 MG tablet Take 1 tablet (10 mg total) by mouth daily.   indomethacin (INDOCIN) 25 MG capsule TAKE 1 CAPSULE (25 MG TOTAL) BY MOUTH 2 (TWO) TIMES DAILY WITH A MEAL.   nitroGLYCERIN (NITROSTAT) 0.4 MG SL tablet Place 1 tablet (0.4 mg total) under the tongue every 5 (five) minutes as needed for chest pain.   pantoprazole (PROTONIX) 40 MG tablet Take 1 tablet (40 mg total) by mouth daily.   sildenafil (VIAGRA) 100 MG tablet Take 1 tablet (100 mg total) by mouth daily as needed for erectile dysfunction.       06/02/2022    8:43 AM 12/01/2021    8:00 AM 12/01/2020    8:25 AM 05/27/2020    8:11 AM  GAD 7 : Generalized Anxiety Score  Nervous, Anxious, on Edge 0 0 0 0  Control/stop worrying 0 0 0 0  Worry too much - different things 0 0 0 0  Trouble relaxing 0 0 0 0  Restless 0 0 0 0  Easily annoyed or irritable 0 0 0 0  Afraid - awful might happen 0 0 0 0  Total GAD 7 Score  0 0 0 0  Anxiety Difficulty Not difficult at all Not difficult at all         12/02/2022    8:46 AM 06/02/2022    8:43 AM 12/01/2021    8:00 AM  Depression screen PHQ 2/9  Decreased Interest 0 0 0  Down, Depressed, Hopeless 0 0 0  PHQ - 2 Score 0 0 0  Altered sleeping 0 0 0  Tired, decreased energy 0 0 0  Change in appetite 0 0 0  Feeling bad or failure about yourself  0 0 0  Trouble concentrating 0 0 0  Moving slowly or fidgety/restless 0 0 0  Suicidal thoughts 0 0 0  PHQ-9 Score 0 0 0  Difficult doing work/chores  Not difficult at all Not difficult at all    BP Readings from Last 3 Encounters:  12/02/22 (!) 142/82  06/02/22 128/70  12/01/21 128/78    Physical Exam Vitals and nursing note reviewed.  HENT:     Head: Normocephalic.     Right Ear: Tympanic membrane and external ear normal.     Left Ear: Tympanic membrane and external ear normal.     Nose: Nose normal.  Eyes:     General: No scleral icterus.       Right eye: No discharge.         Left eye: No discharge.     Conjunctiva/sclera: Conjunctivae normal.     Pupils: Pupils are equal, round, and reactive to light.  Neck:     Thyroid: No thyromegaly.     Vascular: No JVD.     Trachea: No tracheal deviation.  Cardiovascular:     Rate and Rhythm: Normal rate and regular rhythm.     Heart sounds: Normal heart sounds. No murmur heard.    No friction rub. No gallop.  Pulmonary:     Effort: No respiratory distress.     Breath sounds: Normal breath sounds. No wheezing, rhonchi or rales.  Abdominal:     General: Bowel sounds are normal.     Palpations: Abdomen is soft. There is no mass.     Tenderness: There is no abdominal tenderness. There is no guarding or rebound.  Musculoskeletal:        General: No tenderness. Normal range of motion.     Cervical back: Normal range of motion and neck supple.  Lymphadenopathy:     Cervical: No cervical adenopathy.  Skin:    General: Skin is warm.     Findings: No rash.  Neurological:     Mental Status: He is alert and oriented to person, place, and time.     Cranial Nerves: No cranial nerve deficit.     Deep Tendon Reflexes: Reflexes are normal and symmetric.     Wt Readings from Last 3 Encounters:  12/02/22 261 lb (118.4 kg)  06/02/22 254 lb (115.2 kg)  12/01/21 244 lb (110.7 kg)    BP (!) 142/82   Pulse (!) 56   Ht 6\' 2"  (1.88 m)   Wt 261 lb (118.4 kg)   SpO2 99%   BMI 33.51 kg/m   Assessment and Plan: 1. Essential hypertension Chronic.  Controlled.  Stable.  Blood pressure elevated at 142/82.  Asymptomatic.  Tolerating medications well.  We will increase carvedilol to 12.5 mg twice a day and will recheck blood pressure in 4 to 6 weeks.  Will check CMP for electrolytes and GFR. - Comprehensive metabolic panel - carvedilol (COREG) 12.5 MG tablet; Take  1 tablet (12.5 mg total) by mouth 2 (two) times daily with a meal.  Dispense: 180 tablet; Refill: 1  2. Hyperlipidemia LDL goal <70 Chronic.  Controlled.  Stable.   Continue atorvastatin 80 mg once a day.  And Zetia 10 mg once a day.  Will check lipid panel for current level of control.  Will check CMP for electrolytes and hepatic concerns. - Comprehensive metabolic panel - Lipid Panel With LDL/HDL Ratio - atorvastatin (LIPITOR) 80 MG tablet; Take 1 tablet (80 mg total) by mouth daily.  Dispense: 90 tablet; Refill: 1  3. Gastroesophageal reflux disease, unspecified whether esophagitis present Controlled.  Stable.  Continue pantoprazole 40 mg once a day. - pantoprazole (PROTONIX) 40 MG tablet; Take 1 tablet (40 mg total) by mouth daily.  Dispense: 90 tablet; Refill: 1  4. Vasculogenic erectile dysfunction, unspecified vasculogenic erectile dysfunction type .  Excellent results.  Stable.  Patient would like to continue sildenafil 100 mg as needed basis. - sildenafil (VIAGRA) 100 MG tablet; Take 1 tablet (100 mg total) by mouth daily as needed for erectile dysfunction.  Dispense: 15 tablet; Refill: 11  5. Herniation of lumbar intervertebral disc with radiculopathy History of herniated disc controlled on indomethacin 25 mg twice a day. - indomethacin (INDOCIN) 25 MG capsule; Take 1 capsule (25 mg total) by mouth 2 (two) times daily with a meal.  Dispense: 60 capsule; Refill: 5  6. Gout, unspecified cause, unspecified chronicity, unspecified site Episodic.  Patient is currently asymptomatic and not having acute flare but has indomethacin to increase to 50 mg twice a day as needed. - indomethacin (INDOCIN) 25 MG capsule; Take 1 capsule (25 mg total) by mouth 2 (two) times daily with a meal.  Dispense: 60 capsule; Refill: 5     Elizabeth Sauer, MD

## 2022-12-03 LAB — LIPID PANEL WITH LDL/HDL RATIO
Cholesterol, Total: 124 mg/dL (ref 100–199)
HDL: 57 mg/dL (ref >39)
LDL Chol Calc (NIH): 51 mg/dL (ref 0–99)
LDL/HDL Ratio: 0.9 {ratio} (ref 0.0–3.6)
Triglycerides: 81 mg/dL (ref 0–149)
VLDL Cholesterol Cal: 16 mg/dL (ref 5–40)

## 2022-12-03 LAB — COMPREHENSIVE METABOLIC PANEL
ALT: 32 [IU]/L (ref 0–44)
AST: 49 [IU]/L — ABNORMAL HIGH (ref 0–40)
Albumin: 4.3 g/dL (ref 3.8–4.9)
Alkaline Phosphatase: 77 [IU]/L (ref 44–121)
BUN/Creatinine Ratio: 13 (ref 9–20)
BUN: 13 mg/dL (ref 6–24)
Bilirubin Total: 0.7 mg/dL (ref 0.0–1.2)
CO2: 21 mmol/L (ref 20–29)
Calcium: 9.6 mg/dL (ref 8.7–10.2)
Chloride: 104 mmol/L (ref 96–106)
Creatinine, Ser: 0.98 mg/dL (ref 0.76–1.27)
Globulin, Total: 2.1 g/dL (ref 1.5–4.5)
Glucose: 91 mg/dL (ref 70–99)
Potassium: 5 mmol/L (ref 3.5–5.2)
Sodium: 141 mmol/L (ref 134–144)
Total Protein: 6.4 g/dL (ref 6.0–8.5)
eGFR: 90 mL/min/{1.73_m2} (ref >59)

## 2022-12-23 ENCOUNTER — Other Ambulatory Visit: Payer: Self-pay | Admitting: Family Medicine

## 2022-12-23 DIAGNOSIS — I10 Essential (primary) hypertension: Secondary | ICD-10-CM

## 2022-12-29 ENCOUNTER — Ambulatory Visit: Payer: BC Managed Care – PPO | Admitting: Family Medicine

## 2022-12-29 ENCOUNTER — Encounter: Payer: Self-pay | Admitting: Family Medicine

## 2022-12-29 VITALS — BP 138/80 | HR 68 | Ht 74.0 in | Wt 252.0 lb

## 2022-12-29 DIAGNOSIS — I10 Essential (primary) hypertension: Secondary | ICD-10-CM

## 2022-12-29 MED ORDER — CARVEDILOL 12.5 MG PO TABS
12.5000 mg | ORAL_TABLET | Freq: Two times a day (BID) | ORAL | 1 refills | Status: DC
Start: 2022-12-29 — End: 2023-06-19

## 2022-12-29 NOTE — Progress Notes (Signed)
Date:  12/29/2022   Name:  Rodney Briggs   DOB:  29-Jul-1965   MRN:  [id number]   Chief Complaint: Hypertension  Hypertension This is a chronic problem. The current episode started more than 1 year ago. The problem has been gradually improving since onset. The problem is controlled. Pertinent negatives include no blurred vision, chest pain, headaches, orthopnea, palpitations, peripheral edema, PND, shortness of breath or sweats. There are no associated agents to hypertension. Past treatments include alpha 1 blockers. The current treatment provides moderate improvement. There are no compliance problems.  Hypertensive end-organ damage includes CAD/MI. There is no history of CVA. There is no history of chronic renal disease, a hypertension causing med or renovascular disease.    Lab Results  Component Value Date   NA 141 12/02/2022   K 5.0 12/02/2022   CO2 21 12/02/2022   GLUCOSE 91 12/02/2022   BUN 13 12/02/2022   CREATININE 0.98 12/02/2022   CALCIUM 9.6 12/02/2022   EGFR 90 12/02/2022   GFRNONAA 80 12/03/2019   Lab Results  Component Value Date   CHOL 124 12/02/2022   HDL 57 12/02/2022   LDLCALC 51 12/02/2022   TRIG 81 12/02/2022   CHOLHDL 2.6 04/15/2020   No results found for: "TSH" Lab Results  Component Value Date   HGBA1C 5.6 12/13/2019   Lab Results  Component Value Date   WBC 7.1 12/05/2018   HGB 14.7 12/05/2018   HCT 43.3 12/05/2018   MCV 88 12/05/2018   PLT 209 12/05/2018   Lab Results  Component Value Date   ALT 32 12/02/2022   AST 49 (H) 12/02/2022   ALKPHOS 77 12/02/2022   BILITOT 0.7 12/02/2022   No results found for: "25OHVITD2", "25OHVITD3", "VD25OH"   Review of Systems  Eyes:  Negative for blurred vision and visual disturbance.  Respiratory:  Negative for cough, chest tightness, shortness of breath and wheezing.   Cardiovascular:  Negative for chest pain, palpitations, orthopnea and PND.  Gastrointestinal:  Negative for abdominal pain and  blood in stool.  Neurological:  Negative for headaches.    Patient Active Problem List   Diagnosis Date Noted   Gastritis 05/11/2021   Special screening for malignant neoplasms, colon    Polyp of ascending colon    Essential hypertension 12/13/2018   Elevated blood pressure reading 12/13/2018   Pain in both upper extremities 06/01/2018   Hyperlipidemia LDL goal <70 01/18/2017   Abnormal nuclear stress test 08/19/2015   Coronary artery disease involving native coronary artery of native heart without angina pectoris 08/19/2015   Mild mitral regurgitation     Allergies  Allergen Reactions   Meloxicam Other (See Comments)    Rectal bleeding    Past Surgical History:  Procedure Laterality Date   CARDIAC CATHETERIZATION Left 08/19/2015   Procedure: Left Heart Cath and Coronary Angiography;  Surgeon: Marykay Lex, MD;  Location: [facility] INVASIVE CV LAB;  Service: Cardiovascular;  Laterality: Left;   CARDIAC CATHETERIZATION N/A 08/19/2015   Procedure: Coronary Stent Intervention;  Surgeon: Marykay Lex, MD;  Location: [facility] INVASIVE CV LAB;  Service: Cardiovascular;  Laterality: N/A;   COLONOSCOPY WITH PROPOFOL N/A 07/06/2020   Procedure: COLONOSCOPY WITH BIOPSY;  Surgeon: Midge Minium, MD;  Location: [facility];  Service: Endoscopy;  Laterality: N/A;   POLYPECTOMY N/A 07/06/2020   Procedure: POLYPECTOMY;  Surgeon: Midge Minium, MD;  Location: [facility];  Service: Endoscopy;  Laterality: N/A;    Social History   Tobacco  Use   Smoking status: Never   Smokeless tobacco: Never  Vaping Use   Vaping status: Never Used  Substance Use Topics   Alcohol use: Yes    Alcohol/week: 24.0 standard drinks of alcohol    Types: 24 Cans of beer per week    Comment: weekly   Drug use: No     Medication list has been reviewed and updated.  Current Meds  Medication Sig   atorvastatin (LIPITOR) 80 MG tablet Take 1 tablet (80 mg total) by mouth daily.   carvedilol (COREG)  12.5 MG tablet Take 1 tablet (12.5 mg total) by mouth 2 (two) times daily with a meal.   [facility] ASPIRIN ADULT LOW DOSE 81 MG chewable tablet CHEW 1 TABLET BY MOUTH EVERY DAY   ezetimibe (ZETIA) 10 MG tablet Take 1 tablet (10 mg total) by mouth daily.   indomethacin (INDOCIN) 25 MG capsule Take 1 capsule (25 mg total) by mouth 2 (two) times daily with a meal.   nitroGLYCERIN (NITROSTAT) 0.4 MG SL tablet Place 1 tablet (0.4 mg total) under the tongue every 5 (five) minutes as needed for chest pain.   pantoprazole (PROTONIX) 40 MG tablet Take 1 tablet (40 mg total) by mouth daily.   scopolamine (TRANSDERM-SCOP) 1 MG/3DAYS Place 1 patch (1.5 mg total) onto the skin every 3 (three) days.   scopolamine (TRANSDERM-SCOP, 1.5 MG,) 1 MG/3DAYS Place 1 patch (1.5 mg total) onto the skin every 3 (three) days.   sildenafil (VIAGRA) 100 MG tablet Take 1 tablet (100 mg total) by mouth daily as needed for erectile dysfunction.   [DISCONTINUED] carvedilol (COREG) 6.25 MG tablet TAKE 1 TABLET BY MOUTH 2 TIMES DAILY WITH A MEAL.       06/02/2022    8:43 AM 12/01/2021    8:00 AM 12/01/2020    8:25 AM 05/27/2020    8:11 AM  GAD 7 : Generalized Anxiety Score  Nervous, Anxious, on Edge 0 0 0 0  Control/stop worrying 0 0 0 0  Worry too much - different things 0 0 0 0  Trouble relaxing 0 0 0 0  Restless 0 0 0 0  Easily annoyed or irritable 0 0 0 0  Afraid - awful might happen 0 0 0 0  Total GAD 7 Score 0 0 0 0  Anxiety Difficulty Not difficult at all Not difficult at all         12/02/2022    8:46 AM 06/02/2022    8:43 AM 12/01/2021    8:00 AM  Depression screen PHQ 2/9  Decreased Interest 0 0 0  Down, Depressed, Hopeless 0 0 0  PHQ - 2 Score 0 0 0  Altered sleeping 0 0 0  Tired, decreased energy 0 0 0  Change in appetite 0 0 0  Feeling bad or failure about yourself  0 0 0  Trouble concentrating 0 0 0  Moving slowly or fidgety/restless 0 0 0  Suicidal thoughts 0 0 0  PHQ-9 Score 0 0 0  Difficult doing  work/chores  Not difficult at all Not difficult at all    BP Readings from Last 3 Encounters:  12/29/22 138/80  12/02/22 (!) 142/82  06/02/22 128/70    Physical Exam Vitals reviewed.  Constitutional:      Appearance: Normal appearance.  HENT:     Right Ear: Tympanic membrane normal.     Left Ear: Tympanic membrane normal.     Nose: Nose normal.  Eyes:  Pupils: Pupils are equal, round, and reactive to light.  Cardiovascular:     Heart sounds: No murmur heard.    No friction rub. No gallop.  Pulmonary:     Breath sounds: No wheezing, rhonchi or rales.  Neurological:     Mental Status: He is alert.     Wt Readings from Last 3 Encounters:  12/29/22 252 lb (114.3 kg)  12/02/22 261 lb (118.4 kg)  06/02/22 254 lb (115.2 kg)    BP 138/80   Pulse 68   Ht 6\' 2"  (1.88 m)   Wt 252 lb (114.3 kg)   SpO2 97%   BMI 32.35 kg/m   Assessment and Plan: 1. Essential hypertension Chronic.  Controlled.  Stable.  Patient has increased his carvedilol to 12.5 mg by doubling up on his 6.25 mg tablets.  He is tolerating this well without side effects and blood pressure today measures at 138/80 and will continue at current dosing and recheck in 6 months.  In the meantime I have encouraged him to avoid sodium.  (I.e. no salt on margarita glass). - carvedilol (COREG) 12.5 MG tablet; Take 1 tablet (12.5 mg total) by mouth 2 (two) times daily with a meal.  Dispense: 180 tablet; Refill: 1     Elizabeth Sauer, MD

## 2023-01-02 ENCOUNTER — Ambulatory Visit: Payer: BC Managed Care – PPO | Admitting: Family Medicine

## 2023-03-06 ENCOUNTER — Ambulatory Visit: Payer: BC Managed Care – PPO | Attending: Physician Assistant | Admitting: Physician Assistant

## 2023-03-06 ENCOUNTER — Encounter: Payer: Self-pay | Admitting: Physician Assistant

## 2023-03-06 VITALS — BP 148/90 | HR 56 | Ht 74.0 in | Wt 254.8 lb

## 2023-03-06 DIAGNOSIS — E785 Hyperlipidemia, unspecified: Secondary | ICD-10-CM

## 2023-03-06 DIAGNOSIS — I10 Essential (primary) hypertension: Secondary | ICD-10-CM

## 2023-03-06 DIAGNOSIS — I2510 Atherosclerotic heart disease of native coronary artery without angina pectoris: Secondary | ICD-10-CM | POA: Diagnosis not present

## 2023-03-06 MED ORDER — AMLODIPINE BESYLATE 5 MG PO TABS: 5.0000 mg | ORAL_TABLET | Freq: Every day | ORAL | 3 refills | Status: DC

## 2023-03-06 NOTE — Progress Notes (Signed)
 Cardiology Office Note    Date:  03/06/2023   ID:  Rodney Briggs, DOB 13-Jul-1965, MRN [id number]  PCP:  Joshua Cathryne BROCKS, MD  Cardiologist:  Lonni Hanson, MD  Electrophysiologist:  None   Chief Complaint: Follow up  History of Present Illness:   Rodney Briggs is a 58 y.o. male with history of CAD s/p PCI to OM3 in 07/2015, mild mitral regurgitation, and GERD who presents for follow up of his CAD.    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 Overall, this was an intermediate risk study.  Subsequent LHC on 08/19/2015 that showed an occluded OM3 which was treated successfully with PCI/DES, along with 40% ramus stenosis, normal EF and LVEDP.  Echo at that time showed an EF of 60-65%, no RWMA, normal LV diastolic function, mild mitral regurgitation, mildly dilated left atrium, normal RVSF and PASP.  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 There were no EKG changes consistent with ischemia.  He exercised for 8 minutes and 37 seconds, achieving 10.1 METs.  Overall, this was a low risk study.  He was last seen in the office in 07/2021 and was doing well from a cardiac perspective, without symptoms of angina or decompensation.  Most recent ETT from 02/2022 was low risk with no electrocardiographic evidence of ischemia with the patient exercising for 7 minutes and 16 seconds with normal blood pressure response.  He comes in doing well from a cardiac perspective, without symptoms of angina or cardiac decompensation.  No dizziness, presyncope, or syncope.  No lower extremity swelling, abdominal  distention, or orthopnea.  No falls or symptoms concerning for bleeding.  Adherent and tolerating cardiac medications without off target effect.   Labs independently reviewed: 11/2022 - TC 124, TG 81, HDL 57, LDL 51, BUN 13, serum creatinine 0.98, potassium 5.0, albumin 4.3, AST 49, ALT normal  Past Medical History:  Diagnosis Date   CAD S/P percutaneous coronary angioplasty 08/19/2015   a. 08/14/15 Ex MV: med defect of moderate sev- basal inf, basal, inferolat, mid inf, and mid inferolat wall. Findings c/w prior MI in LCx dist w/ mild per-infarct ischemia. EF 43%, intermediate risk; b. 08/19/15 Cath/PCI: occluded OM3 s/p PCI/DES with Xience DES 2.25 mm x 18 mm, ramus 40%, inferolateral wall HK c/w occluded OM branch, nl EF, nl LVEDP; c. 06/2019 ETT: 8:37, 10.1 mets, no ST/T changes.   GERD (gastroesophageal reflux disease)    Gout    Hyperlipidemia LDL goal <70    Mild mitral regurgitation    a. echo 08/14/15: EF 60-65%, no RWMA, LV dia fxn nl, mild MR, LA mildly dilated, RV sys fxn nl, PASP nl    Past Surgical History:  Procedure Laterality Date   CARDIAC CATHETERIZATION Left 08/19/2015   Procedure: Left Heart Cath and Coronary Angiography;  Surgeon: Alm LELON Clay, MD;  Location: [facility] INVASIVE CV LAB;  Service: Cardiovascular;  Laterality: Left;   CARDIAC CATHETERIZATION N/A 08/19/2015   Procedure: Coronary Stent Intervention;  Surgeon: Alm LELON Clay, MD;  Location: [facility] INVASIVE CV LAB;  Service: Cardiovascular;  Laterality: N/A;   COLONOSCOPY WITH  PROPOFOL  N/A 07/06/2020   Procedure: COLONOSCOPY WITH BIOPSY;  Surgeon: Jinny Carmine, MD;  Location: [facility];  Service: Endoscopy;  Laterality: N/A;   POLYPECTOMY N/A 07/06/2020   Procedure: POLYPECTOMY;  Surgeon: Jinny Carmine, MD;  Location: [facility];  Service: Endoscopy;  Laterality: N/A;    Current Medications: Current Meds  Medication Sig   amLODipine  (NORVASC ) 5 MG tablet Take 1 tablet (5 mg total) by mouth daily.    atorvastatin  (LIPITOR ) 80 MG tablet Take 1 tablet (80 mg total) by mouth daily.   carvedilol  (COREG ) 12.5 MG tablet Take 1 tablet (12.5 mg total) by mouth 2 (two) times daily with a meal.   [employer] ASPIRIN  ADULT LOW DOSE 81 MG chewable tablet CHEW 1 TABLET BY MOUTH EVERY DAY   ezetimibe  (ZETIA ) 10 MG tablet Take 1 tablet (10 mg total) by mouth daily.   indomethacin  (INDOCIN ) 25 MG capsule Take 1 capsule (25 mg total) by mouth 2 (two) times daily with a meal.   pantoprazole  (PROTONIX ) 40 MG tablet Take 1 tablet (40 mg total) by mouth daily.   scopolamine  (TRANSDERM-SCOP) 1 MG/3DAYS Place 1 patch (1.5 mg total) onto the skin every 3 (three) days.   sildenafil  (VIAGRA ) 100 MG tablet Take 1 tablet (100 mg total) by mouth daily as needed for erectile dysfunction.    Allergies:   Meloxicam    Social History   Socioeconomic History   Marital status: Divorced    Spouse name: Not on file   Number of children: Not on file   Years of education: Not on file   Highest education level: Not on file  Occupational History   Not on file  Tobacco Use   Smoking status: Never   Smokeless tobacco: Never  Vaping Use   Vaping status: Never Used  Substance and Sexual Activity   Alcohol use: Yes    Alcohol/week: 24.0 standard drinks of alcohol    Types: 24 Cans of beer per week    Comment: weekly   Drug use: No   Sexual activity: Yes  Other Topics Concern   Not on file  Social History Narrative   Not on file   Social Drivers of Health   Financial Resource Strain: Not on file  Food Insecurity: Not on file  Transportation Needs: Not on file  Physical Activity: Not on file  Stress: Not on file  Social Connections: Not on file     Family History:  The patient's family history includes Heart disease in his father and mother; Stroke in his mother.  ROS:   12-point review of systems is negative unless otherwise noted in the HPI.   EKGs/Labs/Other Studies Reviewed:    Studies reviewed were  summarized above. The additional studies were reviewed today:  2D echo 07/2015: - Left ventricle: The cavity size was normal. Systolic function was    normal. The estimated ejection fraction was in the range of 60%    to 65%. Wall motion was normal; there were no regional wall    motion abnormalities. Left ventricular diastolic function    parameters were normal.  - Mitral valve: There was mild regurgitation.  - Left atrium: The atrium was mildly dilated.  - Right ventricle: Systolic function was normal.  - Pulmonary arteries: Systolic pressure was within the normal    range. __________   [facility] 07/2015: Ost 3rd Mrg to 3rd Mrg lesion, 100% stenosed - likely subacute/borderline chronic but with evidence of old clot. Post intervention with Xience DES 2.25  mm 18 mm (2.45 mm), there is a 0% residual stenosis. Ramus lesion, 40% stenosed. The left ventricular systolic function is normal with regional wall motion abnormality consistent with inferolateral OM branch occlusion normal LVEDP   Very accurate findings on Myoview  stress test single vessel CAD in the major lateral OM3  Branch. Successful percutaneous intervention of the OM 3 with a single DES stent.   Plan: Overnight monitoring on telemetry floor with TR band removal per protocol Add statin DAPT for minimum of 3 months, can then stop aspirin  and continue Brilinta .  Would be acceptable to convert to Plavix after one month Continue other risk factor modification. The patient is a relatively labor-intensive/physical job. Nasal counseled on low stress activities with no use of the right arm/wrist over the weekend. __________   ETT 07/02/2019: Blood pressure demonstrated a normal response to exercise. There was no ST segment deviation noted during stress. No T wave inversion was noted during stress. Overall, the patient's exercise capacity was normal. Duke Treadmill Score: low risk   Negative stress test without evidence of ischemia at  given workload. __________  ETT 03/09/2022:   No ST deviation was noted.   Exercise time 7 minutes and 16 seconds with normal blood pressure response.   Low risk exercise treadmill test with no electrocardiographic evidence of ischemia.   EKG:  EKG is ordered today.  The EKG ordered today demonstrates sinus bradycardia, 56 bpm, significant baseline wandering and artifact without evidence of acute ST-T changes  Recent Labs: 12/02/2022: ALT 32; BUN 13; Creatinine, Ser 0.98; Potassium 5.0; Sodium 141  Recent Lipid Panel    Component Value Date/Time   CHOL 124 12/02/2022 1006   TRIG 81 12/02/2022 1006   HDL 57 12/02/2022 1006   CHOLHDL 2.6 04/15/2020 0754   VLDL 24 04/15/2020 0754   LDLCALC 51 12/02/2022 1006    PHYSICAL EXAM:    VS:  BP (!) 148/90 (BP Location: Left Arm, Patient Position: Sitting)   Pulse (!) 56   Ht 6' 2 (1.88 m)   Wt 254 lb 12.8 oz (115.6 kg)   SpO2 98%   BMI 32.71 kg/m   BMI: Body mass index is 32.71 kg/m.  Physical Exam Vitals reviewed.  Constitutional:      Appearance: He is well-developed.  HENT:     Head: Normocephalic and atraumatic.  Eyes:     General:        Right eye: No discharge.        Left eye: No discharge.  Neck:     Vascular: No JVD.  Cardiovascular:     Rate and Rhythm: Normal rate and regular rhythm.     Pulses:          Posterior tibial pulses are 2+ on the right side and 2+ on the left side.     Heart sounds: Normal heart sounds, S1 normal and S2 normal. Heart sounds not distant. No midsystolic click and no opening snap. No murmur heard.    No friction rub.  Pulmonary:     Effort: Pulmonary effort is normal. No respiratory distress.     Breath sounds: Normal breath sounds. No decreased breath sounds, wheezing, rhonchi or rales.  Chest:     Chest wall: No tenderness.  Abdominal:     General: There is no distension.  Musculoskeletal:     Cervical back: Normal range of motion.     Right lower leg: No edema.     Left lower  leg: No edema.  Skin:    General: Skin is warm and dry.     Nails: There is no clubbing.  Neurological:     Mental Status: He is alert and oriented to person, place, and time.  Psychiatric:        Speech: Speech normal.        Behavior: Behavior normal.        Thought Content: Thought content normal.        Judgment: Judgment normal.     Wt Readings from Last 3 Encounters:  03/06/23 254 lb 12.8 oz (115.6 kg)  12/29/22 252 lb (114.3 kg)  12/02/22 261 lb (118.4 kg)     ASSESSMENT & PLAN:   CAD involving the native coronary arteries without angina: He is doing well and without symptoms concerning for angina or cardiac decompensation.  Continue secondary prevention and aggressive risk factor modification including aspirin , atorvastatin , ezetimibe , and carvedilol .  He has not needed any as needed SL NTG.  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  HTN: Blood pressure is mildly elevated in the office today.  Add amlodipine  5 mg daily.  Continue carvedilol  12.5 mg twice daily.  Low-sodium diet recommended.  HLD: LDL 51 in 11/2022.  He remains on atorvastatin  80 mg and ezetimibe .     Disposition: F/u with Dr. Mady or an APP in 6 months.   Medication Adjustments/Labs and Tests Ordered: Current medicines are reviewed at length with the patient today.  Concerns regarding medicines are outlined above. Medication changes, Labs and Tests ordered today are summarized above and listed in the Patient Instructions accessible in Encounters.   Signed, Bernardino Bring, PA-C 03/06/2023 10:01 AM     [facility] [place] [address] [place], [place] [address] [phone]

## 2023-03-06 NOTE — Patient Instructions (Signed)
 Medication Instructions:  Your physician recommends the following medication changes.  START TAKING: Amlodipine  5 mg daily  *If you need a refill on your cardiac medications before your next appointment, please call your pharmacy*   Lab Work: None ordered at this time  If you have labs (blood work) drawn today and your tests are completely normal, you will receive your results only by: MyChart Message (if you have MyChart) OR A paper copy in the mail If you have any lab test that is abnormal or we need to change your treatment, we will call you to review the results.   Follow-Up: At [facility], you and your health needs are our priority.  As part of our continuing mission to provide you with exceptional heart care, we have created designated Provider Care Teams.  These Care Teams include your primary Cardiologist (physician) and Advanced Practice Providers (APPs -  Physician Assistants and Nurse Practitioners) who all work together to provide you with the care you need, when you need it.  We recommend signing up for the patient portal called MyChart.  Sign up information is provided on this After Visit Summary.  MyChart is used to connect with patients for Virtual Visits (Telemedicine).  Patients are able to view lab/test results, encounter notes, upcoming appointments, etc.  Non-urgent messages can be sent to your provider as well.   To learn more about what you can do with MyChart, go to [email or web address].    Your next appointment:   6 month(s)  Provider:   You may see  or one of the following Advanced Practice Providers on your designated Care Team:   Bernardino Bring, PA-C

## 2023-03-27 ENCOUNTER — Encounter: Payer: Self-pay | Admitting: Family Medicine

## 2023-03-27 ENCOUNTER — Ambulatory Visit: Payer: BC Managed Care – PPO | Admitting: Family Medicine

## 2023-03-27 VITALS — BP 118/60 | HR 79 | Ht 74.0 in | Wt 258.0 lb

## 2023-03-27 DIAGNOSIS — G4733 Obstructive sleep apnea (adult) (pediatric): Secondary | ICD-10-CM

## 2023-03-27 NOTE — Progress Notes (Signed)
Date:  03/27/2023   Name:  Rodney Briggs   DOB:  02/13/1966   MRN:  [id number]   Chief Complaint: Snoring (Patient says he snores at night. Had a DOT physical and was told he needs a sleep study. ) and Hypertension  Patient is a 58 year old male who presents for a face to face encounter for sleep study exam. The patient reports the following problems: OSA/DOT evaluation/increased neck circunference. Health maintenance has been reviewed up to date.    Hypertension This is a chronic problem. The current episode started more than 1 year ago. The problem has been gradually improving since onset. The problem is controlled. Pertinent negatives include no blurred vision, chest pain, headaches, malaise/fatigue, neck pain, orthopnea, palpitations, peripheral edema, PND, shortness of breath or sweats. There are no associated agents to hypertension. Risk factors for coronary artery disease include dyslipidemia and male gender. Past treatments include calcium channel blockers, alpha 1 blockers and beta blockers. The current treatment provides moderate improvement. There are no compliance problems.  There is no history of angina, CAD/MI or CVA. There is no history of chronic renal disease, a hypertension causing med or renovascular disease.       03/27/2023    8:46 AM  Results of the Epworth flowsheet  Sitting and reading 1  Watching TV 3  Sitting, inactive in a public place (e.g. a theatre or a meeting) 0  As a passenger in a car for an hour without a break 0  Lying down to rest in the afternoon when circumstances permit 0  Sitting and talking to someone 0  Sitting quietly after a lunch without alcohol 0  In a car, while stopped for a few minutes in traffic 0  Total score 4     Lab Results  Component Value Date   NA 141 12/02/2022   K 5.0 12/02/2022   CO2 21 12/02/2022   GLUCOSE 91 12/02/2022   BUN 13 12/02/2022   CREATININE 0.98 12/02/2022   CALCIUM 9.6 12/02/2022   EGFR 90 12/02/2022    GFRNONAA 80 12/03/2019   Lab Results  Component Value Date   CHOL 124 12/02/2022   HDL 57 12/02/2022   LDLCALC 51 12/02/2022   TRIG 81 12/02/2022   CHOLHDL 2.6 04/15/2020   No results found for: "TSH" Lab Results  Component Value Date   HGBA1C 5.6 12/13/2019   Lab Results  Component Value Date   WBC 7.1 12/05/2018   HGB 14.7 12/05/2018   HCT 43.3 12/05/2018   MCV 88 12/05/2018   PLT 209 12/05/2018   Lab Results  Component Value Date   ALT 32 12/02/2022   AST 49 (H) 12/02/2022   ALKPHOS 77 12/02/2022   BILITOT 0.7 12/02/2022   No results found for: "25OHVITD2", "25OHVITD3", "VD25OH"   Review of Systems  Constitutional:  Negative for fatigue, malaise/fatigue and unexpected weight change.  Eyes:  Negative for blurred vision and visual disturbance.  Respiratory:  Negative for cough, chest tightness, shortness of breath and wheezing.   Cardiovascular:  Negative for chest pain, palpitations, orthopnea and PND.  Gastrointestinal:  Negative for abdominal distention, abdominal pain, blood in stool, constipation and diarrhea.  Endocrine: Negative for polydipsia and polyuria.  Genitourinary:  Negative for hematuria.  Musculoskeletal:  Negative for neck pain.  Neurological:  Negative for headaches.    Patient Active Problem List   Diagnosis Date Noted   Gastritis 05/11/2021   Special screening for malignant neoplasms, colon  Polyp of ascending colon    Essential hypertension 12/13/2018   Elevated blood pressure reading 12/13/2018   Pain in both upper extremities 06/01/2018   Hyperlipidemia LDL goal <70 01/18/2017   Abnormal nuclear stress test 08/19/2015   Coronary artery disease involving native coronary artery of native heart without angina pectoris 08/19/2015   Mild mitral regurgitation     Allergies  Allergen Reactions   Meloxicam Other (See Comments)    Rectal bleeding    Past Surgical History:  Procedure Laterality Date   CARDIAC CATHETERIZATION Left  08/19/2015   Procedure: Left Heart Cath and Coronary Angiography;  Surgeon: Marykay Lex, MD;  Location: [facility] INVASIVE CV LAB;  Service: Cardiovascular;  Laterality: Left;   CARDIAC CATHETERIZATION N/A 08/19/2015   Procedure: Coronary Stent Intervention;  Surgeon: Marykay Lex, MD;  Location: [facility] INVASIVE CV LAB;  Service: Cardiovascular;  Laterality: N/A;   COLONOSCOPY WITH PROPOFOL N/A 07/06/2020   Procedure: COLONOSCOPY WITH BIOPSY;  Surgeon: Midge Minium, MD;  Location: [facility];  Service: Endoscopy;  Laterality: N/A;   POLYPECTOMY N/A 07/06/2020   Procedure: POLYPECTOMY;  Surgeon: Midge Minium, MD;  Location: [facility];  Service: Endoscopy;  Laterality: N/A;    Social History   Tobacco Use   Smoking status: Never   Smokeless tobacco: Never  Vaping Use   Vaping status: Never Used  Substance Use Topics   Alcohol use: Yes    Alcohol/week: 24.0 standard drinks of alcohol    Types: 24 Cans of beer per week    Comment: weekly   Drug use: No     Medication list has been reviewed and updated.  Current Meds  Medication Sig   amLODipine (NORVASC) 5 MG tablet Take 1 tablet (5 mg total) by mouth daily.   atorvastatin (LIPITOR) 80 MG tablet Take 1 tablet (80 mg total) by mouth daily.   carvedilol (COREG) 12.5 MG tablet Take 1 tablet (12.5 mg total) by mouth 2 (two) times daily with a meal.   [employer] ASPIRIN ADULT LOW DOSE 81 MG chewable tablet CHEW 1 TABLET BY MOUTH EVERY DAY   ezetimibe (ZETIA) 10 MG tablet Take 1 tablet (10 mg total) by mouth daily.   indomethacin (INDOCIN) 25 MG capsule Take 1 capsule (25 mg total) by mouth 2 (two) times daily with a meal.   pantoprazole (PROTONIX) 40 MG tablet Take 1 tablet (40 mg total) by mouth daily.   sildenafil (VIAGRA) 100 MG tablet Take 1 tablet (100 mg total) by mouth daily as needed for erectile dysfunction.   [DISCONTINUED] scopolamine (TRANSDERM-SCOP) 1 MG/3DAYS Place 1 patch (1.5 mg total) onto the skin every 3  (three) days.       06/02/2022    8:43 AM 12/01/2021    8:00 AM 12/01/2020    8:25 AM 05/27/2020    8:11 AM  GAD 7 : Generalized Anxiety Score  Nervous, Anxious, on Edge 0 0 0 0  Control/stop worrying 0 0 0 0  Worry too much - different things 0 0 0 0  Trouble relaxing 0 0 0 0  Restless 0 0 0 0  Easily annoyed or irritable 0 0 0 0  Afraid - awful might happen 0 0 0 0  Total GAD 7 Score 0 0 0 0  Anxiety Difficulty Not difficult at all Not difficult at all         12/02/2022    8:46 AM 06/02/2022    8:43 AM 12/01/2021    8:00 AM  Depression screen PHQ 2/9  Decreased Interest 0 0 0  Down, Depressed, Hopeless 0 0 0  PHQ - 2 Score 0 0 0  Altered sleeping 0 0 0  Tired, decreased energy 0 0 0  Change in appetite 0 0 0  Feeling bad or failure about yourself  0 0 0  Trouble concentrating 0 0 0  Moving slowly or fidgety/restless 0 0 0  Suicidal thoughts 0 0 0  PHQ-9 Score 0 0 0  Difficult doing work/chores  Not difficult at all Not difficult at all    BP Readings from Last 3 Encounters:  03/27/23 118/60  03/06/23 (!) 148/90  12/29/22 138/80    Physical Exam Vitals and nursing note reviewed.  Constitutional:      Appearance: He is well-developed.  HENT:     Head: Normocephalic and atraumatic.     Right Ear: Tympanic membrane, ear canal and external ear normal.     Left Ear: Tympanic membrane, ear canal and external ear normal.     Nose: Nose normal.     Mouth/Throat:     Dentition: Normal dentition.  Eyes:     General: Lids are normal. No scleral icterus.    Conjunctiva/sclera: Conjunctivae normal.     Pupils: Pupils are equal, round, and reactive to light.  Neck:     Thyroid: No thyromegaly.     Vascular: No carotid bruit, hepatojugular reflux or JVD.     Trachea: No tracheal deviation.  Cardiovascular:     Rate and Rhythm: Normal rate and regular rhythm.     Heart sounds: Normal heart sounds. No murmur heard.    No gallop.  Pulmonary:     Effort: Pulmonary  effort is normal.     Breath sounds: Normal breath sounds. No wheezing, rhonchi or rales.  Abdominal:     General: Bowel sounds are normal.     Palpations: Abdomen is soft. There is no hepatomegaly, splenomegaly or mass.     Tenderness: There is no abdominal tenderness.     Hernia: There is no hernia in the left inguinal area.  Musculoskeletal:        General: Normal range of motion.     Cervical back: Normal range of motion and neck supple.  Lymphadenopathy:     Cervical: No cervical adenopathy.  Skin:    General: Skin is warm and dry.     Findings: No rash.  Neurological:     Mental Status: He is alert and oriented to person, place, and time.     Sensory: No sensory deficit.     Deep Tendon Reflexes: Reflexes are normal and symmetric.  Psychiatric:        Mood and Affect: Mood is not anxious or depressed.     Wt Readings from Last 3 Encounters:  03/27/23 258 lb (117 kg)  03/06/23 254 lb 12.8 oz (115.6 kg)  12/29/22 252 lb (114.3 kg)    BP 118/60   Pulse 79   Ht 6\' 2"  (1.88 m)   Wt 258 lb (117 kg)   SpO2 98%   BMI 33.13 kg/m   Assessment and Plan: 1. OSA (obstructive sleep apnea) (Primary) Chronic.  Symptomatic.  Stable.  Patient went to DOT physical and had a neck circumference of 19 inches.  Patient has been given a 44-month period in which to be evaluated within perhaps initiated on CPAP for sleep apnea.  Diagnostics and therapeutics will be handled with referral to ear nose and throat which I  explained to the patient would hasten along evaluation as - Ambulatory referral to ENT    Elizabeth Sauer, MD

## 2023-04-04 DIAGNOSIS — G4733 Obstructive sleep apnea (adult) (pediatric): Secondary | ICD-10-CM | POA: Diagnosis not present

## 2023-04-13 DIAGNOSIS — G4733 Obstructive sleep apnea (adult) (pediatric): Secondary | ICD-10-CM | POA: Diagnosis not present

## 2023-04-13 DIAGNOSIS — G4730 Sleep apnea, unspecified: Secondary | ICD-10-CM | POA: Diagnosis not present

## 2023-05-10 DIAGNOSIS — G4733 Obstructive sleep apnea (adult) (pediatric): Secondary | ICD-10-CM | POA: Diagnosis not present

## 2023-06-10 DIAGNOSIS — G4733 Obstructive sleep apnea (adult) (pediatric): Secondary | ICD-10-CM | POA: Diagnosis not present

## 2023-06-11 ENCOUNTER — Other Ambulatory Visit: Payer: Self-pay | Admitting: Family Medicine

## 2023-06-11 DIAGNOSIS — M5116 Intervertebral disc disorders with radiculopathy, lumbar region: Secondary | ICD-10-CM

## 2023-06-11 DIAGNOSIS — M109 Gout, unspecified: Secondary | ICD-10-CM

## 2023-06-18 ENCOUNTER — Other Ambulatory Visit: Payer: Self-pay | Admitting: Family Medicine

## 2023-06-18 DIAGNOSIS — K219 Gastro-esophageal reflux disease without esophagitis: Secondary | ICD-10-CM

## 2023-06-19 ENCOUNTER — Other Ambulatory Visit: Payer: Self-pay | Admitting: Family Medicine

## 2023-06-19 DIAGNOSIS — I10 Essential (primary) hypertension: Secondary | ICD-10-CM

## 2023-06-19 DIAGNOSIS — G4733 Obstructive sleep apnea (adult) (pediatric): Secondary | ICD-10-CM | POA: Diagnosis not present

## 2023-07-10 DIAGNOSIS — G4733 Obstructive sleep apnea (adult) (pediatric): Secondary | ICD-10-CM | POA: Diagnosis not present

## 2023-07-13 ENCOUNTER — Other Ambulatory Visit: Payer: Self-pay | Admitting: Family Medicine

## 2023-07-13 DIAGNOSIS — M109 Gout, unspecified: Secondary | ICD-10-CM

## 2023-07-13 DIAGNOSIS — M5116 Intervertebral disc disorders with radiculopathy, lumbar region: Secondary | ICD-10-CM

## 2023-08-10 DIAGNOSIS — G4733 Obstructive sleep apnea (adult) (pediatric): Secondary | ICD-10-CM | POA: Diagnosis not present

## 2023-08-11 ENCOUNTER — Other Ambulatory Visit: Payer: Self-pay

## 2023-08-11 DIAGNOSIS — E785 Hyperlipidemia, unspecified: Secondary | ICD-10-CM

## 2023-08-11 MED ORDER — ATORVASTATIN CALCIUM 80 MG PO TABS: 80.0000 mg | ORAL_TABLET | Freq: Every day | ORAL | 0 refills | Status: DC

## 2023-09-09 DIAGNOSIS — G4733 Obstructive sleep apnea (adult) (pediatric): Secondary | ICD-10-CM | POA: Diagnosis not present

## 2023-10-03 ENCOUNTER — Ambulatory Visit: Admitting: Student

## 2023-10-03 ENCOUNTER — Encounter: Payer: Self-pay | Admitting: Student

## 2023-10-03 VITALS — BP 138/86 | HR 69 | Ht 74.0 in | Wt 256.2 lb

## 2023-10-03 DIAGNOSIS — R600 Localized edema: Secondary | ICD-10-CM | POA: Diagnosis not present

## 2023-10-03 DIAGNOSIS — I2510 Atherosclerotic heart disease of native coronary artery without angina pectoris: Secondary | ICD-10-CM | POA: Diagnosis not present

## 2023-10-03 DIAGNOSIS — M109 Gout, unspecified: Secondary | ICD-10-CM | POA: Insufficient documentation

## 2023-10-03 DIAGNOSIS — I10 Essential (primary) hypertension: Secondary | ICD-10-CM

## 2023-10-03 DIAGNOSIS — D122 Benign neoplasm of ascending colon: Secondary | ICD-10-CM

## 2023-10-03 DIAGNOSIS — K2970 Gastritis, unspecified, without bleeding: Secondary | ICD-10-CM

## 2023-10-03 DIAGNOSIS — M1A09X0 Idiopathic chronic gout, multiple sites, without tophus (tophi): Secondary | ICD-10-CM

## 2023-10-03 DIAGNOSIS — N529 Male erectile dysfunction, unspecified: Secondary | ICD-10-CM | POA: Insufficient documentation

## 2023-10-03 DIAGNOSIS — G4733 Obstructive sleep apnea (adult) (pediatric): Secondary | ICD-10-CM | POA: Insufficient documentation

## 2023-10-03 DIAGNOSIS — F1090 Alcohol use, unspecified, uncomplicated: Secondary | ICD-10-CM | POA: Insufficient documentation

## 2023-10-03 MED ORDER — ATORVASTATIN CALCIUM 80 MG PO TABS: 80.0000 mg | ORAL_TABLET | Freq: Every day | ORAL | 1 refills | Status: AC

## 2023-10-03 NOTE — Progress Notes (Addendum)
 Established Patient Office Visit  Subjective   Patient ID: Rodney Briggs, male    DOB: 04-27-1965  Age: 58 y.o. MRN: [id number]  Chief Complaint  Patient presents with   Hyperlipidemia   Hypertension   Transitions Of Care   Rodney Briggs is a 58 y.o. person living with CAD s/p PCI with DES to OM3, HLD, gout, obesity, mitral regurgitation, and erectile dysfunction presents today for transfer of care from PCP Dr. Joshua who recently retired.   Reports increase LE edema around the ankles for the past 8 months. Does use a recliner. 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 Denies CP, dyspnea, orthopnea, or joint pain,   Patient Active Problem List   Diagnosis Date Noted   OSA (obstructive sleep apnea) 10/03/2023   Erectile dysfunction 10/03/2023   Gout 10/03/2023   Alcohol use 10/03/2023   Leg edema 10/03/2023   Gastritis 05/11/2021   History of colonic polyps    Polyp of ascending colon    Essential hypertension 12/13/2018   Hyperlipidemia LDL goal <70 01/18/2017   Coronary artery disease involving native coronary artery of native heart without angina pectoris 08/19/2015   Mild mitral regurgitation       ROS Refer to HPI    Objective:     BP 138/86   Pulse 69   Ht 6' 2 (1.88 m)   Wt 256 lb 4 oz (116.2 kg)   SpO2 98%   BMI 32.90 kg/m  BP Readings from Last 3 Encounters:  10/03/23 138/86  03/27/23 118/60  03/06/23 (!) 148/90    Physical Exam Constitutional:      Appearance: Normal appearance.  Cardiovascular:     Rate and Rhythm: Normal rate and regular rhythm.  Pulmonary:     Effort: Pulmonary effort is normal.     Breath sounds: No rhonchi or rales.  Abdominal:     General: Abdomen is flat. Bowel sounds are normal. There is no distension.     Palpations: Abdomen is soft.     Tenderness: There is no abdominal tenderness.  Musculoskeletal:     Right lower leg: Edema (2+ up to shins) present.     Left lower leg: Edema (2+ up to shins)  present.     Comments: No significant joint redness, warmth, swelling, or pain  Skin:    General: Skin is warm and dry.     Capillary Refill: Capillary refill takes less than 2 seconds.     Comments: No skin breakdown of the b/l lower extermities   Neurological:     General: No focal deficit present.     Mental Status: He is alert and oriented to person, place, and time.  Psychiatric:        Mood and Affect: Mood normal.        Behavior: Behavior normal.        10/03/2023    8:03 AM 12/02/2022    8:46 AM 06/02/2022    8:43 AM  Depression screen PHQ 2/9  Decreased Interest 0 0 0  Down, Depressed, Hopeless 0 0 0  PHQ - 2 Score 0 0 0  Altered sleeping 0 0 0  Tired, decreased energy 0 0 0  Change in appetite 0 0 0  Feeling bad or failure about yourself  0 0 0  Trouble concentrating 0 0 0  Moving slowly or fidgety/restless 0 0 0  Suicidal thoughts 0 0 0  PHQ-9 Score 0 0 0  Difficult doing work/chores Not difficult at all  Not difficult at all       10/03/2023    8:03 AM 06/02/2022    8:43 AM 12/01/2021    8:00 AM 12/01/2020    8:25 AM  GAD 7 : Generalized Anxiety Score  Nervous, Anxious, on Edge 0 0 0 0  Control/stop worrying 0 0 0 0  Worry too much - different things 0 0 0 0  Trouble relaxing 0 0 0 0  Restless 0 0 0 0  Easily annoyed or irritable 0 0 0 0  Afraid - awful might happen 0 0 0 0  Total GAD 7 Score 0 0 0 0  Anxiety Difficulty Not difficult at all Not difficult at all Not difficult at all     No results found for any visits on 10/03/23.  Last CBC Lab Results  Component Value Date   WBC 7.1 12/05/2018   HGB 14.7 12/05/2018   HCT 43.3 12/05/2018   MCV 88 12/05/2018   MCH 30.0 12/05/2018   RDW 13.3 12/05/2018   PLT 209 12/05/2018   Last metabolic panel Lab Results  Component Value Date   GLUCOSE 91 12/02/2022   NA 141 12/02/2022   K 5.0 12/02/2022   CL 104 12/02/2022   CO2 21 12/02/2022   BUN 13 12/02/2022   CREATININE 0.98 12/02/2022   EGFR  90 12/02/2022   CALCIUM  9.6 12/02/2022   PHOS 3.4 06/02/2022   PROT 6.4 12/02/2022   ALBUMIN 4.3 12/02/2022   LABGLOB 2.1 12/02/2022   AGRATIO 2.1 12/01/2021   BILITOT 0.7 12/02/2022   ALKPHOS 77 12/02/2022   AST 49 (H) 12/02/2022   ALT 32 12/02/2022   ANIONGAP 6 01/11/2017      The ASCVD Risk score (Arnett DK, et al., 2019) failed to calculate for the following reasons:   The valid total cholesterol range is 130 to 320 mg/dL    Assessment & Plan:  Leg edema Assessment & Plan: Bilateral pitting edema of both feet up to mid shin, may be due to amlodipine  as he feels symptoms started around 8 months ago. He is unsure if he is taking amlodipine  or if this may swelling worse. I have asked him to bring in medications to next visit. Discussed elevation and compression stockings which he is agreeable to. Also encouraged reducing alcohol use.    OSA (obstructive sleep apnea) Assessment & Plan: Using CPAP most nights but sometimes unable to wear it long. Encouraged him to wear CPAP as much as possible   Essential hypertension Assessment & Plan: BP mildly elevated today. Medications are amlodipine  5 mg and carvedilol  12.5 mg twice daily. He is taking carvedilol . Amlodipine  was started in January by cariology however he is unaware of this and is not sure if he is taking this. Appears he has been filling this since January. He notes increase LE edema for the past 8 months but is not sure if this preceded cardiology visit. Will have him bring medications to his next visit. I -Continue current medications -He will bring in home medications to next visit.  -If he is taking amlodipine  will discuss switching to ARB at next visit to see if this improve LE edema   Coronary artery disease involving native coronary artery of native heart without angina pectoris Assessment & Plan: No anginal symptoms. Continue ASA 81 mg daily, coreg  12.5 mg twice daily, ezetimibe  10 mg daily, and atorvastatin  80 mg  daily. LDL well control at 51 in  October 2024. Will obtain lipid panel at next visit. Refilled atorvastatin  80 mg daily.    Orders: -     Atorvastatin  Calcium ; Take 1 tablet (80 mg total) by mouth daily.  Dispense: 90 tablet; Refill: 1  Hyperlipidemia LDL goal <70  Vasculogenic erectile dysfunction, unspecified vasculogenic erectile dysfunction type Assessment & Plan: Doing well on sildenafil  as needed, uses this about once a month.    Adenomatous polyp of ascending colon Assessment & Plan: Last colonoscopy on 07/06/2020 with TX of the ascending colon x2 and sigmoid colon, next due in 2027   Gastritis without bleeding, unspecified chronicity, unspecified gastritis type Assessment & Plan: Well controlled on protonix  40 mg daily    Alcohol use Assessment & Plan: Reports he drinks about a six pack of beer daily or similar number of drinks in cocktails. No history of significant withdrawals or DT. Discussed reducing use and risk of alcohol use including worsening LE edema and gout flares. He is precontemplative. Will continue to discuss this with him. AST mildly elevated on CMP from 11/2022 at 49 will check platelets and CMP at next visit.    Chronic gout of multiple sites, unspecified cause Assessment & Plan: Currently taking indomethacin  25 mg daily. Reports he has not had a a gout flare in at least a year, history of flares in ankle, foot, and wrist and treated with indomethacin , was continue on this for lumbar radiculopathy that has since resolved. Uric acid was 7.6 in 01/03/2017 and has not had it checked since. No flare today. Will have him discontinue indomethacin  and monitor for flares. If have frequent flares may need to be started on uric acid lowering agent. Does have significant daily alcohol use recommended he decrease alcohol consumption.       Return in about 2 months (around 12/03/2023) for physical.    Harlene Saddler, MD

## 2023-10-03 NOTE — Assessment & Plan Note (Addendum)
 No anginal symptoms. Continue ASA 81 mg daily, coreg  12.5 mg twice daily, ezetimibe  10 mg daily, and atorvastatin  80 mg daily. LDL well control at 51 in October 2024. Will obtain lipid panel at next visit. Refilled atorvastatin  80 mg daily.

## 2023-10-03 NOTE — Assessment & Plan Note (Signed)
 Currently taking indomethacin  25 mg daily. Reports he has not had a a gout flare in at least a year, history of flares in ankle, foot, and wrist and treated with indomethacin , was continue on this for lumbar radiculopathy that has since resolved. Uric acid was 7.6 in 01/03/2017 and has not had it checked since. No flare today. Will have him discontinue indomethacin  and monitor for flares. If have frequent flares may need to be started on uric acid lowering agent. Does have significant daily alcohol use recommended he decrease alcohol consumption.

## 2023-10-03 NOTE — Assessment & Plan Note (Addendum)
 BP mildly elevated today. Medications are amlodipine  5 mg and carvedilol  12.5 mg twice daily. He is taking carvedilol . Amlodipine  was started in January by cariology however he is unaware of this and is not sure if he is taking this. Appears he has been filling this since January. He notes increase LE edema for the past 8 months but is not sure if this preceded cardiology visit. Will have him bring medications to his next visit. I -Continue current medications -He will bring in home medications to next visit.  -If he is taking amlodipine  will discuss switching to ARB at next visit to see if this improve LE edema

## 2023-10-03 NOTE — Assessment & Plan Note (Addendum)
 Reports he drinks about a six pack of beer daily or similar number of drinks in cocktails. No history of significant withdrawals or DT. Discussed reducing use and risk of alcohol use including worsening LE edema and gout flares. He is precontemplative. Will continue to discuss this with him. AST mildly elevated on CMP from 11/2022 at 49 will check platelets and CMP at next visit.

## 2023-10-03 NOTE — Assessment & Plan Note (Addendum)
 Using CPAP most nights but sometimes unable to wear it long. Encouraged him to wear CPAP as much as possible

## 2023-10-03 NOTE — Assessment & Plan Note (Signed)
 Bilateral pitting edema of both feet up to mid shin, may be due to amlodipine  as he feels symptoms started around 8 months ago. He is unsure if he is taking amlodipine  or if this may swelling worse. I have asked him to bring in medications to next visit. Discussed elevation and compression stockings which he is agreeable to. Also encouraged reducing alcohol use.

## 2023-10-03 NOTE — Assessment & Plan Note (Signed)
Well controlled on protonix 40mg daily. 

## 2023-10-03 NOTE — Assessment & Plan Note (Signed)
 Doing well on sildenafil  as needed, uses this about once a month.

## 2023-10-03 NOTE — Assessment & Plan Note (Signed)
 Last colonoscopy on 07/06/2020 with TX of the ascending colon x2 and sigmoid colon, next due in 2027

## 2023-10-03 NOTE — Patient Instructions (Addendum)
 Wear compression stocking for leg swelling at nighttime  You can try to stop the indomethacin , if having back pain or gout flares you can resume the medication   Please bring your medication to your next appointment

## 2023-10-10 DIAGNOSIS — G4733 Obstructive sleep apnea (adult) (pediatric): Secondary | ICD-10-CM | POA: Diagnosis not present

## 2023-12-04 ENCOUNTER — Ambulatory Visit: Attending: Physician Assistant | Admitting: Physician Assistant

## 2023-12-04 ENCOUNTER — Encounter: Payer: Self-pay | Admitting: Physician Assistant

## 2023-12-04 VITALS — BP 132/84 | HR 70 | Ht 74.0 in | Wt 256.0 lb

## 2023-12-04 DIAGNOSIS — Z79899 Other long term (current) drug therapy: Secondary | ICD-10-CM

## 2023-12-04 DIAGNOSIS — I10 Essential (primary) hypertension: Secondary | ICD-10-CM | POA: Diagnosis not present

## 2023-12-04 DIAGNOSIS — E785 Hyperlipidemia, unspecified: Secondary | ICD-10-CM | POA: Diagnosis not present

## 2023-12-04 DIAGNOSIS — I2510 Atherosclerotic heart disease of native coronary artery without angina pectoris: Secondary | ICD-10-CM | POA: Diagnosis not present

## 2023-12-04 DIAGNOSIS — Z0289 Encounter for other administrative examinations: Secondary | ICD-10-CM

## 2023-12-04 NOTE — Progress Notes (Signed)
 Cardiology Office Note    Date:  12/04/2023   ID:  FAITH BRANAN, DOB 06/02/1965, MRN [id number]  PCP:  Lemon Raisin, MD  Cardiologist:  Lonni Hanson, MD  Electrophysiologist:  None   Chief Complaint: Follow up  History of Present Illness:   Rodney Briggs is a 58 y.o. male with history of CAD s/p PCI to OM3 in 07/2015, mild mitral regurgitation, and GERD who presents for follow up of his CAD.   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 Overall, this was an intermediate risk study.  Subsequent LHC on 08/19/2015 that showed an occluded OM3 which was treated successfully with PCI/DES, along with 40% ramus stenosis, normal EF and LVEDP.  Echo at that time showed an EF of 60-65%, no RWMA, normal LV diastolic function, mild mitral regurgitation, mildly dilated left atrium, normal RVSF and PASP.  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 There were no EKG changes consistent with ischemia.  He exercised for 8 minutes and 37 seconds, achieving 10.1 METs.  Overall, this was a low risk study.  Most recent ETT from 02/2022 was low risk with no electrocardiographic evidence of ischemia with the patient exercising for 7 minutes and 16 seconds with normal blood pressure response.  He was last seen in the office in 02/2023 and was doing well from a cardiac perspective.  He comes in doing very well from a cardiac perspective and is without symptoms of angina or cardiac decompensation.  No dizziness, presyncope, or syncope.  No lower extremity swelling or progressive orthopnea.  No falls,  hematochezia, or melena.  Adherent to cardiac pharmacotherapy without off target effect.  Does not check blood pressure at home.  Reports unhealthy diet.  Does not have acute cardiac concerns at this time.  Due for DOT renewal in 05/2024.   Labs independently reviewed: 11/2022 - TC 124, TG 81, HDL 57, LDL 51, BUN 13, serum creatinine 0.98, potassium 5.0, albumin 4.3, AST 49, ALT normal   Past Medical History:  Diagnosis Date   CAD S/P percutaneous coronary angioplasty 08/19/2015   a. 08/14/15 Ex MV: med defect of moderate sev- basal inf, basal, inferolat, mid inf, and mid inferolat wall. Findings c/w prior MI in LCx dist w/ mild per-infarct ischemia. EF 43%, intermediate risk; b. 08/19/15 Cath/PCI: occluded OM3 s/p PCI/DES with Xience DES 2.25 mm x 18 mm, ramus 40%, inferolateral wall HK c/w occluded OM branch, nl EF, nl LVEDP; c. 06/2019 ETT: 8:37, 10.1 mets, no ST/T changes.   GERD (gastroesophageal reflux disease)    Gout    Hyperlipidemia LDL goal <70    Mild mitral regurgitation    a. echo 08/14/15: EF 60-65%, no RWMA, LV dia fxn nl, mild MR, LA mildly dilated, RV sys fxn nl, PASP nl    Past Surgical History:  Procedure Laterality Date   CARDIAC CATHETERIZATION Left 08/19/2015   Procedure: Left Heart Cath and Coronary Angiography;  Surgeon: Alm LELON Clay, MD;  Location: [facility] INVASIVE CV LAB;  Service: Cardiovascular;  Laterality: Left;   CARDIAC CATHETERIZATION N/A 08/19/2015   Procedure: Coronary Stent Intervention;  Surgeon: Alm LELON Clay, MD;  Location: [facility] INVASIVE CV LAB;  Service: Cardiovascular;  Laterality: N/A;   COLONOSCOPY WITH PROPOFOL  N/A 07/06/2020   Procedure: COLONOSCOPY WITH BIOPSY;  Surgeon: Jinny Carmine, MD;  Location: [facility] SURGERY CNTR;  Service: Endoscopy;  Laterality: N/A;   POLYPECTOMY N/A 07/06/2020   Procedure: POLYPECTOMY;  Surgeon: Jinny Carmine, MD;  Location: [facility];  Service: Endoscopy;  Laterality: N/A;    Current Medications: Current Meds   Medication Sig   amLODipine  (NORVASC ) 5 MG tablet Take 1 tablet (5 mg total) by mouth daily.   atorvastatin  (LIPITOR ) 80 MG tablet Take 1 tablet (80 mg total) by mouth daily.   carvedilol  (COREG ) 12.5 MG tablet TAKE 1 TABLET BY MOUTH TWICE A DAY WITH MEALS   [facility] ASPIRIN  ADULT LOW DOSE 81 MG chewable tablet CHEW 1 TABLET BY MOUTH EVERY DAY   ezetimibe  (ZETIA ) 10 MG tablet Take 1 tablet (10 mg total) by mouth daily.   indomethacin  (INDOCIN ) 25 MG capsule TAKE 1 CAPSULE BY MOUTH 2 TIMES DAILY WITH A MEAL. (Patient taking differently: Take 25 mg by mouth 2 (two) times daily as needed.)   pantoprazole  (PROTONIX ) 40 MG tablet TAKE 1 TABLET BY MOUTH EVERY DAY   sildenafil  (VIAGRA ) 100 MG tablet Take 1 tablet (100 mg total) by mouth daily as needed for erectile dysfunction.    Allergies:   Meloxicam    Social History   Socioeconomic History   Marital status: Divorced    Spouse name: Not on file   Number of children: Not on file   Years of education: Not on file   Highest education level: Not on file  Occupational History   Not on file  Tobacco Use   Smoking status: Never   Smokeless tobacco: Never  Vaping Use   Vaping status: Never Used  Substance and Sexual Activity   Alcohol use: Yes    Alcohol/week: 24.0 standard drinks of alcohol    Types: 24 Cans of beer per week    Comment: weekly   Drug use: No   Sexual activity: Yes  Other Topics Concern   Not on file  Social History Narrative   Not on file   Social Drivers of Health   Financial Resource Strain: Not on file  Food Insecurity: Not on file  Transportation Needs: Not on file  Physical Activity: Not on file  Stress: Not on file  Social Connections: Not on file     Family History:  The patient's family history includes Heart disease in his father and mother; Stroke in his mother.  ROS:   12-point review of systems is negative unless otherwise noted in the HPI.   EKGs/Labs/Other Studies Reviewed:    Studies  reviewed were summarized above. The additional studies were reviewed today:  2D echo 07/2015: - Left ventricle: The cavity size was normal. Systolic function was    normal. The estimated ejection fraction was in the range of 60%    to 65%. Wall motion was normal; there were no regional wall    motion abnormalities. Left ventricular diastolic function    parameters were normal.  - Mitral valve: There was mild regurgitation.  - Left atrium: The atrium was mildly dilated.  - Right ventricle: Systolic function was normal.  - Pulmonary arteries: Systolic pressure was within the normal    range. __________   [facility] 07/2015: Ost 3rd Mrg to 3rd Mrg lesion, 100% stenosed - likely subacute/borderline chronic but with evidence of old clot. Post  intervention with Xience DES 2.25 mm 18 mm (2.45 mm), there is a 0% residual stenosis. Ramus lesion, 40% stenosed. The left ventricular systolic function is normal with regional wall motion abnormality consistent with inferolateral OM branch occlusion normal LVEDP   Very accurate findings on Myoview  stress test single vessel CAD in the major lateral OM3  Branch. Successful percutaneous intervention of the OM 3 with a single DES stent.   Plan: Overnight monitoring on telemetry floor with TR band removal per protocol Add statin DAPT for minimum of 3 months, can then stop aspirin  and continue Brilinta .  Would be acceptable to convert to Plavix after one month Continue other risk factor modification. The patient is a relatively labor-intensive/physical job. Nasal counseled on low stress activities with no use of the right arm/wrist over the weekend. __________   ETT 07/02/2019: Blood pressure demonstrated a normal response to exercise. There was no ST segment deviation noted during stress. No T wave inversion was noted during stress. Overall, the patient's exercise capacity was normal. [facility] Treadmill Score: low risk   Negative stress test without evidence  of ischemia at given workload. __________   ETT 03/09/2022:   No ST deviation was noted.   Exercise time 7 minutes and 16 seconds with normal blood pressure response.   Low risk exercise treadmill test with no electrocardiographic evidence of ischemia.   EKG:  EKG is ordered today.  The EKG ordered today demonstrates NSR, 70 bpm, no acute ST-T changes  Recent Labs: No results found for requested labs within last 365 days.  Recent Lipid Panel    Component Value Date/Time   CHOL 124 12/02/2022 1006   TRIG 81 12/02/2022 1006   HDL 57 12/02/2022 1006   CHOLHDL 2.6 04/15/2020 0754   VLDL 24 04/15/2020 0754   LDLCALC 51 12/02/2022 1006    PHYSICAL EXAM:    VS:  BP 132/84   Pulse 70   Ht 6' 2 (1.88 m)   Wt 256 lb (116.1 kg)   SpO2 96%   BMI 32.87 kg/m   BMI: Body mass index is 32.87 kg/m.  Physical Exam Vitals reviewed.  Constitutional:      Appearance: He is well-developed.  HENT:     Head: Normocephalic and atraumatic.  Eyes:     General:        Right eye: No discharge.        Left eye: No discharge.  Cardiovascular:     Rate and Rhythm: Normal rate and regular rhythm.     Heart sounds: Normal heart sounds, S1 normal and S2 normal. Heart sounds not distant. No midsystolic click and no opening snap. No murmur heard.    No friction rub.  Pulmonary:     Effort: Pulmonary effort is normal. No respiratory distress.     Breath sounds: Normal breath sounds. No decreased breath sounds, wheezing, rhonchi or rales.  Musculoskeletal:     Cervical back: Normal range of motion.     Right lower leg: No edema.     Left lower leg: No edema.  Skin:    General: Skin is warm and dry.     Nails: There is no clubbing.  Neurological:     Mental Status: He is alert and oriented to person, place, and time.  Psychiatric:        Speech: Speech normal.        Behavior: Behavior normal.        Thought Content: Thought content normal.  Judgment: Judgment normal.     Wt  Readings from Last 3 Encounters:  12/04/23 256 lb (116.1 kg)  10/03/23 256 lb 4 oz (116.2 kg)  03/27/23 258 lb (117 kg)     ASSESSMENT & PLAN:   CAD involving the native coronary arteries without angina: He continues to do well and is without symptoms concerning for angina or cardiac decompensation.  Continue aggressive risk factor modification and secondary prevention including aspirin  81 mg, ezetimibe  10 mg, atorvastatin  80 mg, and carvedilol  12.5 mg twice daily.  As part of DOT guidelines, he will need a GXT prior to next DOT medical exam.  He would like to schedule this for 01/2024.  HTN: Blood pressure is mildly elevated at triage at 140/80 with repeat blood pressure 132/84.  Likely exacerbated by high sodium intake in diet.  Continue amlodipine  5 mg and carvedilol  12.5 mg twice daily.  Continue to monitor.  Low-sodium intake recommended.  HLD: LDL 51 in 11/2022.  Remains on atorvastatin  80 mg and ezetimibe  10 mg.  Future orders placed for fasting lipid panel and CMP.   Informed Consent   Shared Decision Making/Informed Consent{  The risks [chest pain, shortness of breath, cardiac arrhythmias, dizziness, blood pressure fluctuations, myocardial infarction, stroke/transient ischemic attack, and life-threatening complications (estimated to be 1 in 10,000)], benefits (risk stratification, diagnosing coronary artery disease, treatment guidance) and alternatives of an exercise tolerance test were discussed in detail with Rodney Briggs and he agrees to proceed.        Disposition: F/u with Dr. Mady or an APP in 12 months.   Medication Adjustments/Labs and Tests Ordered: Current medicines are reviewed at length with the patient today.  Concerns regarding medicines are outlined above. Medication changes, Labs and Tests ordered today are summarized above and listed in the Patient Instructions accessible in Encounters.   Signed, Bernardino Bring, PA-C 12/04/2023 4:22 PM     [facility] -  [place] [address] [place], [place] [address] [phone]

## 2023-12-04 NOTE — Patient Instructions (Signed)
 Medication Instructions:  Your physician recommends that you continue on your current medications as directed. Please refer to the Current Medication list given to you today.   *If you need a refill on your cardiac medications before your next appointment, please call your pharmacy*  Lab Work: Your provider would like for you to return in 1 week to have the following labs drawn: CMeT, CBC, Lipid panel.   Please go to [facility] [address] [facility] [place] [address] You do not need an appointment.  You DO need to be fasting - nothing but water  after midnight the day before    If you have labs (blood work) drawn today and your tests are completely normal, you will receive your results only by: MyChart Message (if you have MyChart) OR A paper copy in the mail If you have any lab test that is abnormal or we need to change your treatment, we will call you to review the results.  Testing/Procedures: Your provider has ordered a exercise tolerance test in December. This test will evaluate the blood supply to your heart muscle during periods of exercise and rest. For this test, you will raise your heart rate by walking on a treadmill at different levels.   you may eat a light breakfast/ lunch prior to your procedure no caffeine for 24 hours prior to your test (coffee, tea, soft drinks, or chocolate)  no smoking/ vaping for 4 hours prior to your test you may take your regular medications the day of your test except for:   - hold Coreg  (carvedilol ) 24 hours prior to your test bring any inhalers with you to your test wear comfortable clothing & tennis/ non-skid shoes to walk on the treadmill  This will take place at [address] [facility])  [place] [address]  Follow-Up: At [facility], you and your health needs are our priority.  As part of our continuing mission to provide you with exceptional heart care, our providers are all part of one  team.  This team includes your primary Cardiologist (physician) and Advanced Practice Providers or APPs (Physician Assistants and Nurse Practitioners) who all work together to provide you with the care you need, when you need it.  Your next appointment:   1 year(s)  Provider:   You may see Lonni Hanson, MD or Bernardino Bring, PA-C  We recommend signing up for the patient portal called MyChart.  Sign up information is provided on this After Visit Summary.  MyChart is used to connect with patients for Virtual Visits (Telemedicine).  Patients are able to view lab/test results, encounter notes, upcoming appointments, etc.  Non-urgent messages can be sent to your provider as well.   To learn more about what you can do with MyChart, go to [email or web address].

## 2023-12-05 ENCOUNTER — Other Ambulatory Visit: Payer: Self-pay

## 2023-12-05 DIAGNOSIS — K219 Gastro-esophageal reflux disease without esophagitis: Secondary | ICD-10-CM

## 2023-12-05 DIAGNOSIS — I10 Essential (primary) hypertension: Secondary | ICD-10-CM

## 2023-12-05 DIAGNOSIS — E785 Hyperlipidemia, unspecified: Secondary | ICD-10-CM

## 2023-12-05 MED ORDER — CARVEDILOL 12.5 MG PO TABS: 12.5000 mg | ORAL_TABLET | Freq: Two times a day (BID) | ORAL | 1 refills | Status: AC

## 2023-12-05 MED ORDER — EZETIMIBE 10 MG PO TABS: 10.0000 mg | ORAL_TABLET | Freq: Every day | ORAL | 1 refills | Status: AC

## 2023-12-05 MED ORDER — PANTOPRAZOLE SODIUM 40 MG PO TBEC: 40.0000 mg | DELAYED_RELEASE_TABLET | Freq: Every day | ORAL | 1 refills | Status: AC

## 2023-12-05 NOTE — Addendum Note (Signed)
 Addended by: CAMACHO OCAMPO, Matisha Termine M on: 12/05/2023 09:33 AM   Modules accepted: Orders

## 2023-12-06 ENCOUNTER — Encounter: Admitting: Student

## 2023-12-06 ENCOUNTER — Encounter: Payer: Self-pay | Admitting: Student

## 2023-12-06 ENCOUNTER — Other Ambulatory Visit
Admission: RE | Admit: 2023-12-06 | Discharge: 2023-12-06 | Disposition: A | Discharge status: Home / Self Care | Source: Ambulatory Visit | Attending: Physician Assistant | Admitting: Physician Assistant

## 2023-12-06 DIAGNOSIS — E785 Hyperlipidemia, unspecified: Secondary | ICD-10-CM | POA: Diagnosis not present

## 2023-12-06 DIAGNOSIS — Z79899 Other long term (current) drug therapy: Secondary | ICD-10-CM | POA: Insufficient documentation

## 2023-12-06 LAB — COMPREHENSIVE METABOLIC PANEL WITH GFR
ALT: 32 U/L (ref 0–44)
AST: 43 U/L — ABNORMAL HIGH (ref 15–41)
Albumin: 3.8 g/dL (ref 3.5–5.0)
Alkaline Phosphatase: 59 U/L (ref 38–126)
Anion gap: 9 (ref 5–15)
BUN: 10 mg/dL (ref 6–20)
CO2: 26 mmol/L (ref 22–32)
Calcium: 9.2 mg/dL (ref 8.9–10.3)
Chloride: 107 mmol/L (ref 98–111)
Creatinine, Ser: 1.13 mg/dL (ref 0.61–1.24)
GFR, Estimated: >60 mL/min (ref >60)
Glucose, Bld: 108 mg/dL — ABNORMAL HIGH (ref 70–99)
Potassium: 4.6 mmol/L (ref 3.5–5.1)
Sodium: 142 mmol/L (ref 135–145)
Total Bilirubin: 0.6 mg/dL (ref 0.0–1.2)
Total Protein: 7 g/dL (ref 6.5–8.1)

## 2023-12-06 LAB — CBC
HCT: 41.8 % (ref 39.0–52.0)
Hemoglobin: 14.6 g/dL (ref 13.0–17.0)
MCH: 30.7 pg (ref 26.0–34.0)
MCHC: 34.9 g/dL (ref 30.0–36.0)
MCV: 87.8 fL (ref 80.0–100.0)
Platelets: 190 K/uL (ref 150–400)
RBC: 4.76 MIL/uL (ref 4.22–5.81)
RDW: 13.2 % (ref 11.5–15.5)
WBC: 5.8 K/uL (ref 4.0–10.5)
nRBC: 0 % (ref 0.0–0.2)

## 2023-12-06 LAB — LIPID PANEL
Cholesterol: 133 mg/dL (ref 0–200)
HDL: 60 mg/dL (ref >40)
LDL Cholesterol: 60 mg/dL (ref 0–99)
Total CHOL/HDL Ratio: 2.2 ratio
Triglycerides: 66 mg/dL (ref <150)
VLDL: 13 mg/dL (ref 0–40)

## 2023-12-07 ENCOUNTER — Ambulatory Visit: Payer: Self-pay | Admitting: Physician Assistant

## 2023-12-18 ENCOUNTER — Ambulatory Visit: Admitting: Student

## 2023-12-18 ENCOUNTER — Encounter: Payer: Self-pay | Admitting: Student

## 2023-12-18 VITALS — BP 136/86 | HR 63 | Ht 74.0 in | Wt 252.0 lb

## 2023-12-18 DIAGNOSIS — F1090 Alcohol use, unspecified, uncomplicated: Secondary | ICD-10-CM | POA: Diagnosis not present

## 2023-12-18 DIAGNOSIS — Z0000 Encounter for general adult medical examination without abnormal findings: Secondary | ICD-10-CM | POA: Diagnosis not present

## 2023-12-18 DIAGNOSIS — Z23 Encounter for immunization: Secondary | ICD-10-CM | POA: Diagnosis not present

## 2023-12-18 DIAGNOSIS — N529 Male erectile dysfunction, unspecified: Secondary | ICD-10-CM | POA: Diagnosis not present

## 2023-12-18 DIAGNOSIS — M1A09X0 Idiopathic chronic gout, multiple sites, without tophus (tophi): Secondary | ICD-10-CM

## 2023-12-18 MED ORDER — SILDENAFIL CITRATE 100 MG PO TABS: 100.0000 mg | ORAL_TABLET | Freq: Every day | ORAL | 3 refills | Status: AC | PRN

## 2023-12-18 NOTE — Assessment & Plan Note (Signed)
UTD on colon cancer screening 

## 2023-12-18 NOTE — Progress Notes (Unsigned)
 Complete physical exam  Patient: Rodney Briggs   DOB: 1966-01-19   58 y.o. Male  MRN: [id number]  Subjective:    Chief Complaint  Patient presents with   Annual Exam    Rodney Briggs is a 58 y.o. male who presents today for a complete physical exam. He reports consuming a general diet. The patient does not participate in regular exercise at present. He generally feels well. He reports sleeping well. He does not have additional problems to discuss today.   Patient Active Problem List   Diagnosis Date Noted   OSA (obstructive sleep apnea) 10/03/2023   Erectile dysfunction 10/03/2023   Gout 10/03/2023   Alcohol use 10/03/2023   Leg edema 10/03/2023   Gastritis 05/11/2021   History of colonic polyps    Polyp of ascending colon    Essential hypertension 12/13/2018   Hyperlipidemia LDL goal <70 01/18/2017   Coronary artery disease involving native coronary artery of native heart without angina pectoris 08/19/2015   Mild mitral regurgitation       Patient Care Team: Lemon Raisin, MD as PCP - General (Internal Medicine) End, Lonni, MD as PCP - Cardiology (Cardiology)   Outpatient Medications Prior to Visit  Medication Sig   amLODipine  (NORVASC ) 5 MG tablet Take 1 tablet (5 mg total) by mouth daily.   atorvastatin  (LIPITOR ) 80 MG tablet Take 1 tablet (80 mg total) by mouth daily.   carvedilol  (COREG ) 12.5 MG tablet Take 1 tablet (12.5 mg total) by mouth 2 (two) times daily with a meal.   [facility] ASPIRIN  ADULT LOW DOSE 81 MG chewable tablet CHEW 1 TABLET BY MOUTH EVERY DAY   ezetimibe  (ZETIA ) 10 MG tablet Take 1 tablet (10 mg total) by mouth daily.   indomethacin  (INDOCIN ) 25 MG capsule TAKE 1 CAPSULE BY MOUTH 2 TIMES DAILY WITH A MEAL. (Patient taking differently: Take 25 mg by mouth 2 (two) times daily as needed.)   pantoprazole  (PROTONIX ) 40 MG tablet Take 1 tablet (40 mg total) by mouth daily.   sildenafil  (VIAGRA ) 100 MG tablet Take 1 tablet (100 mg total) by mouth  daily as needed for erectile dysfunction.   No facility-administered medications prior to visit.    ROS Refer to HPI     Objective:    BP 130/84   Pulse 63   Ht 6' 2 (1.88 m)   Wt 252 lb (114.3 kg)   SpO2 98%   BMI 32.35 kg/m  BP Readings from Last 3 Encounters:  12/18/23 130/84  12/04/23 132/84  10/03/23 138/86    Physical Exam      10/03/2023    8:03 AM 12/02/2022    8:46 AM 06/02/2022    8:43 AM  Depression screen PHQ 2/9  Decreased Interest 0 0 0  Down, Depressed, Hopeless 0 0 0  PHQ - 2 Score 0 0 0  Altered sleeping 0 0 0  Tired, decreased energy 0 0 0  Change in appetite 0 0 0  Feeling bad or failure about yourself  0 0 0  Trouble concentrating 0 0 0  Moving slowly or fidgety/restless 0 0 0  Suicidal thoughts 0 0 0  PHQ-9 Score 0 0 0  Difficult doing work/chores Not difficult at all  Not difficult at all      10/03/2023    8:03 AM 06/02/2022    8:43 AM 12/01/2021    8:00 AM 12/01/2020    8:25 AM  GAD 7 : Generalized Anxiety Score  Nervous,  Anxious, on Edge 0 0 0 0  Control/stop worrying 0 0 0 0  Worry too much - different things 0 0 0 0  Trouble relaxing 0 0 0 0  Restless 0 0 0 0  Easily annoyed or irritable 0 0 0 0  Afraid - awful might happen 0 0 0 0  Total GAD 7 Score 0 0 0 0  Anxiety Difficulty Not difficult at all Not difficult at all Not difficult at all     No results found for any visits on 12/18/23. Last CBC Lab Results  Component Value Date   WBC 5.8 12/06/2023   HGB 14.6 12/06/2023   HCT 41.8 12/06/2023   MCV 87.8 12/06/2023   MCH 30.7 12/06/2023   RDW 13.2 12/06/2023   PLT 190 12/06/2023   Last metabolic panel Lab Results  Component Value Date   GLUCOSE 108 (H) 12/06/2023   NA 142 12/06/2023   K 4.6 12/06/2023   CL 107 12/06/2023   CO2 26 12/06/2023   BUN 10 12/06/2023   CREATININE 1.13 12/06/2023   GFRNONAA >60 12/06/2023   CALCIUM  9.2 12/06/2023   PHOS 3.4 06/02/2022   PROT 7.0 12/06/2023   ALBUMIN 3.8  12/06/2023   LABGLOB 2.1 12/02/2022   AGRATIO 2.1 12/01/2021   BILITOT 0.6 12/06/2023   ALKPHOS 59 12/06/2023   AST 43 (H) 12/06/2023   ALT 32 12/06/2023   ANIONGAP 9 12/06/2023   Last lipids Lab Results  Component Value Date   CHOL 133 12/06/2023   HDL 60 12/06/2023   LDLCALC 60 12/06/2023   TRIG 66 12/06/2023   CHOLHDL 2.2 12/06/2023   Last hemoglobin A1c Lab Results  Component Value Date   HGBA1C 5.6 12/13/2019   Last thyroid functions No results found for: TSH, T3TOTAL, T4TOTAL, FREET4, THYROIDAB      Assessment & Plan:    Routine Health Maintenance and Physical Exam  Health Maintenance  Topic Date Due   HIV Screening  Never done   Hepatitis C Screening  Never done   Pneumococcal Vaccine for age over 56 (1 of 2 - PCV) Never done   Hepatitis B Vaccine (1 of 3 - 19+ 3-dose series) Never done   Colon Cancer Screening  07/06/2025   DTaP/Tdap/Td vaccine (2 - Td or Tdap) 10/20/2027   Flu Shot  Completed   Zoster (Shingles) Vaccine  Completed   HPV Vaccine  Aged Out   Meningitis B Vaccine  Aged Out   COVID-19 Vaccine  Discontinued    Discussed health benefits of physical activity, and encouraged him to engage in regular exercise appropriate for his age and condition.  Encounter for immunization -     Flu vaccine trivalent PF, 6mos and older(Flulaval,Afluria,Fluarix,Fluzone)    No follow-ups on file.     Harlene Saddler, MD

## 2023-12-19 ENCOUNTER — Ambulatory Visit: Payer: Self-pay | Admitting: Student

## 2023-12-19 DIAGNOSIS — M109 Gout, unspecified: Secondary | ICD-10-CM

## 2023-12-19 DIAGNOSIS — M5116 Intervertebral disc disorders with radiculopathy, lumbar region: Secondary | ICD-10-CM

## 2023-12-19 DIAGNOSIS — M1A09X0 Idiopathic chronic gout, multiple sites, without tophus (tophi): Secondary | ICD-10-CM

## 2023-12-19 LAB — HEMOGLOBIN A1C
Est. average glucose Bld gHb Est-mCnc: 117 mg/dL
Hgb A1c MFr Bld: 5.7 % — ABNORMAL HIGH (ref 4.8–5.6)

## 2023-12-19 LAB — HIV ANTIBODY (ROUTINE TESTING W REFLEX): HIV Screen 4th Generation wRfx: NONREACTIVE

## 2023-12-19 LAB — HEPATITIS C ANTIBODY: Hep C Virus Ab: NONREACTIVE

## 2023-12-19 LAB — TSH: TSH: 3.47 u[IU]/mL (ref 0.450–4.500)

## 2023-12-19 LAB — URIC ACID: Uric Acid: 9.4 mg/dL — ABNORMAL HIGH (ref 3.8–8.4)

## 2023-12-19 MED ORDER — INDOMETHACIN 25 MG PO CAPS: ORAL_CAPSULE | ORAL | 3 refills | Status: DC

## 2023-12-19 MED ORDER — ALLOPURINOL 100 MG PO TABS: 100.0000 mg | ORAL_TABLET | Freq: Every day | ORAL | 3 refills | Status: DC

## 2023-12-21 NOTE — Assessment & Plan Note (Signed)
 Tried to stop indomethacin  but reports joint pain. Uric acid today. Discussed reducing alcohol use, drinks cocktails daily. May benefit from allopurinol.

## 2023-12-21 NOTE — Assessment & Plan Note (Signed)
 -  Continue sildenafil

## 2023-12-21 NOTE — Assessment & Plan Note (Signed)
 Continue to encourage him to reduce alcohol use.

## 2024-01-31 ENCOUNTER — Ambulatory Visit: Attending: Physician Assistant

## 2024-01-31 ENCOUNTER — Other Ambulatory Visit: Payer: Self-pay | Admitting: Student

## 2024-01-31 DIAGNOSIS — M5116 Intervertebral disc disorders with radiculopathy, lumbar region: Secondary | ICD-10-CM

## 2024-01-31 DIAGNOSIS — Z0289 Encounter for other administrative examinations: Secondary | ICD-10-CM

## 2024-01-31 DIAGNOSIS — M109 Gout, unspecified: Secondary | ICD-10-CM

## 2024-01-31 DIAGNOSIS — N529 Male erectile dysfunction, unspecified: Secondary | ICD-10-CM

## 2024-01-31 NOTE — Telephone Encounter (Unsigned)
 Copied from CRM #[id number]. Topic: Clinical - Medication Refill >> Jan 31, 2024 10:25 AM Myrick T wrote: Medication: indomethacin  (INDOCIN ) 25 MG capsule  Has the patient contacted their pharmacy? Yes  This is the patient's preferred pharmacy:  [phone], [place] - [address] [place] [address] Phone: [phone] Fax: [phone]  Is this the correct pharmacy for this prescription? Yes I Has the prescription been filled recently? Yes  Is the patient out of the medication? Yes  Has the patient been seen for an appointment in the last year OR does the patient have an upcoming appointment? Yes  Can we respond through MyChart? No  Agent: Please be advised that Rx refills may take up to 3 business days. We ask that you follow-up with your pharmacy.

## 2024-02-01 LAB — EXERCISE TOLERANCE TEST
Angina Index: 0
Duke Treadmill Score: 8
Estimated workload: 10
Exercise duration (min): 8 min
Exercise duration (sec): 0 s
MPHR: 162 {beats}/min
Peak HR: 146 {beats}/min
Percent HR: 90 %
RPE: 17
Rest HR: 70 {beats}/min
ST Depression (mm): 0 mm

## 2024-02-01 NOTE — Telephone Encounter (Signed)
 Rx 12/18/23 #15 3RF Requested Prescriptions  Pending Prescriptions Disp Refills   sildenafil  (VIAGRA ) 100 MG tablet [Pharmacy Med Name: SILDENAFIL  100 MG TABLET] 45 tablet 1    Sig: TAKE 1 TABLET BY MOUTH EVERY DAY AS NEEDED FOR ERECTILE DYSFUNCTION     Urology: Erectile Dysfunction Agents Failed - 02/01/2024  3:13 PM      Failed - AST in normal range and within 360 days    AST  Date Value Ref Range Status  12/06/2023 43 (H) 15 - 41 U/L Final         Passed - ALT in normal range and within 360 days    ALT  Date Value Ref Range Status  12/06/2023 32 0 - 44 U/L Final         Passed - Last BP in normal range    BP Readings from Last 1 Encounters:  12/18/23 136/86         Passed - Valid encounter within last 12 months    Recent Outpatient Visits           1 month ago Annual physical exam   [place] [facility], Harlene, MD   4 months ago Leg edema   [place] [facility] at [facility], Harlene, MD   10 months ago OSA (obstructive sleep apnea)   [facility] at [facility]ry[facility] JAYSON, MD

## 2024-02-02 MED ORDER — INDOMETHACIN 25 MG PO CAPS: ORAL_CAPSULE | ORAL | 0 refills | Status: DC

## 2024-02-02 NOTE — Telephone Encounter (Signed)
 Requested Prescriptions  Pending Prescriptions Disp Refills   indomethacin  (INDOCIN ) 25 MG capsule 90 capsule 0    Sig: Take 1 capsule by mouth once a day     Analgesics:  NSAIDS Failed - 02/02/2024  8:30 AM      Failed - Manual Review: Labs are only required if the patient has taken medication for more than 8 weeks.      Passed - Cr in normal range and within 360 days    Creatinine, Ser  Date Value Ref Range Status  12/06/2023 1.13 0.61 - 1.24 mg/dL Final         Passed - HGB in normal range and within 360 days    Hemoglobin  Date Value Ref Range Status  12/06/2023 14.6 13.0 - 17.0 g/dL Final  89/85/7979 85.2 13.0 - 17.7 g/dL Final         Passed - PLT in normal range and within 360 days    Platelets  Date Value Ref Range Status  12/06/2023 190 150 - 400 K/uL Final  12/05/2018 209 150 - 450 x10E3/uL Final         Passed - HCT in normal range and within 360 days    HCT  Date Value Ref Range Status  12/06/2023 41.8 39.0 - 52.0 % Final   Hematocrit  Date Value Ref Range Status  12/05/2018 43.3 37.5 - 51.0 % Final         Passed - eGFR is 30 or above and within 360 days    GFR calc Af Amer  Date Value Ref Range Status  12/03/2019 93 >59 mL/min/1.73 Final    Comment:    **[facility] currently reports eGFR in compliance with the current**   recommendations of the [employer]. [facility] will   update reporting as new guidelines are published from the NKF-ASN   Task force.    GFR, Estimated  Date Value Ref Range Status  12/06/2023 >60 >60 mL/min Final    Comment:    (NOTE) Calculated using the CKD-EPI Creatinine Equation (2021)    eGFR  Date Value Ref Range Status  12/02/2022 90 >59 mL/min/1.73 Final         Passed - Patient is not pregnant      Passed - Valid encounter within last 12 months    Recent Outpatient Visits           1 month ago Annual physical exam   [place] [facility] at [facility], MD    4 months ago Leg edema   [place] [facility] at [facility], Harlene, MD   10 months ago OSA (obstructive sleep apnea)   [facility] at [facility] JAYSON, MD

## 2024-02-06 ENCOUNTER — Other Ambulatory Visit: Payer: Self-pay | Admitting: Physician Assistant

## 2024-02-06 DIAGNOSIS — I10 Essential (primary) hypertension: Secondary | ICD-10-CM

## 2024-03-01 ENCOUNTER — Ambulatory Visit: Admitting: Student

## 2024-03-01 ENCOUNTER — Encounter: Payer: Self-pay | Admitting: Student

## 2024-03-01 VITALS — BP 132/84 | HR 70 | Ht 74.0 in | Wt 254.0 lb

## 2024-03-01 DIAGNOSIS — M1A09X0 Idiopathic chronic gout, multiple sites, without tophus (tophi): Secondary | ICD-10-CM | POA: Diagnosis not present

## 2024-03-01 NOTE — Progress Notes (Signed)
 "  Established Patient Office Visit  Subjective   Patient ID: Rodney Briggs, male    DOB: 1965-10-29  Age: 59 y.o. MRN: [id number]  Chief Complaint  Patient presents with   Gout    Rodney Briggs is a 59 y.o. person with medical hx listed below who presents today for gout follow up. Continues to have flares of gout since stating allopurinol . Feels this is associated with eating hot dogs. Feels he is getting over a flare from ~2 weeks ago of the left first and second MTPs. Denies fever, chills, injury, other joint pain, or malaise.  Patient Active Problem List   Diagnosis Date Noted   Annual physical exam 12/18/2023   OSA (obstructive sleep apnea) 10/03/2023   Erectile dysfunction 10/03/2023   Gout 10/03/2023   Alcohol use 10/03/2023   Leg edema 10/03/2023   Gastritis 05/11/2021   History of colonic polyps    Polyp of ascending colon    Essential hypertension 12/13/2018   Hyperlipidemia LDL goal <70 01/18/2017   Coronary artery disease involving native coronary artery of native heart without angina pectoris 08/19/2015   Mild mitral regurgitation       ROS Refer to HPI    Objective:     Outpatient Encounter Medications as of 03/01/2024  Medication Sig   allopurinol  (ZYLOPRIM ) 100 MG tablet Take 1 tablet (100 mg total) by mouth daily.   amLODipine  (NORVASC ) 5 MG tablet TAKE 1 TABLET (5 MG TOTAL) BY MOUTH DAILY.   atorvastatin  (LIPITOR ) 80 MG tablet Take 1 tablet (80 mg total) by mouth daily.   carvedilol  (COREG ) 12.5 MG tablet Take 1 tablet (12.5 mg total) by mouth 2 (two) times daily with a meal.   [facility] ASPIRIN  ADULT LOW DOSE 81 MG chewable tablet CHEW 1 TABLET BY MOUTH EVERY DAY   ezetimibe  (ZETIA ) 10 MG tablet Take 1 tablet (10 mg total) by mouth daily.   indomethacin  (INDOCIN ) 25 MG capsule Take 1 capsule by mouth once a day   pantoprazole  (PROTONIX ) 40 MG tablet Take 1 tablet (40 mg total) by mouth daily.   sildenafil  (VIAGRA ) 100 MG tablet Take 1 tablet (100 mg total)  by mouth daily as needed for erectile dysfunction.   No facility-administered encounter medications on file as of 03/01/2024.    BP 132/84   Pulse 70   Ht 6' 2 (1.88 m)   Wt 254 lb (115.2 kg)   SpO2 98%   BMI 32.61 kg/m  BP Readings from Last 3 Encounters:  03/01/24 132/84  12/18/23 136/86  12/04/23 132/84    Physical Exam Constitutional:      Appearance: Normal appearance.  HENT:     Mouth/Throat:     Mouth: Mucous membranes are moist.     Pharynx: Oropharynx is clear.  Cardiovascular:     Rate and Rhythm: Normal rate and regular rhythm.  Pulmonary:     Effort: Pulmonary effort is normal.     Breath sounds: No rhonchi or rales.  Abdominal:     General: Abdomen is flat. Bowel sounds are normal. There is no distension.     Palpations: Abdomen is soft.     Tenderness: There is no abdominal tenderness.  Musculoskeletal:     Right lower leg: No edema.     Left lower leg: No edema.     Comments: Mild edema of the left first MTP minimal redness or warmth  Skin:    General: Skin is warm and dry.  Capillary Refill: Capillary refill takes less than 2 seconds.  Neurological:     General: No focal deficit present.     Mental Status: He is alert and oriented to person, place, and time.  Psychiatric:        Mood and Affect: Mood normal.        Behavior: Behavior normal.        03/01/2024    7:58 AM 10/03/2023    8:03 AM 12/02/2022    8:46 AM  Depression screen PHQ 2/9  Decreased Interest 0 0 0  Down, Depressed, Hopeless 0 0 0  PHQ - 2 Score 0 0 0  Altered sleeping  0 0  Tired, decreased energy  0 0  Change in appetite  0 0  Feeling bad or failure about yourself   0 0  Trouble concentrating  0 0  Moving slowly or fidgety/restless  0 0  Suicidal thoughts  0 0  PHQ-9 Score  0  0   Difficult doing work/chores  Not difficult at all      Data saved with a previous flowsheet row definition       03/01/2024    7:58 AM 10/03/2023    8:03 AM 06/02/2022    8:43 AM  12/01/2021    8:00 AM  GAD 7 : Generalized Anxiety Score  Nervous, Anxious, on Edge 0 0 0 0  Control/stop worrying 0 0 0 0  Worry too much - different things  0 0 0  Trouble relaxing  0 0 0  Restless  0 0 0  Easily annoyed or irritable  0 0 0  Afraid - awful might happen  0 0 0  Total GAD 7 Score  0 0 0  Anxiety Difficulty  Not difficult at all Not difficult at all Not difficult at all    No results found for any visits on 03/01/24.  Last CBC Lab Results  Component Value Date   WBC 5.8 12/06/2023   HGB 14.6 12/06/2023   HCT 41.8 12/06/2023   MCV 87.8 12/06/2023   MCH 30.7 12/06/2023   RDW 13.2 12/06/2023   PLT 190 12/06/2023   Last metabolic panel Lab Results  Component Value Date   GLUCOSE 108 (H) 12/06/2023   NA 142 12/06/2023   K 4.6 12/06/2023   CL 107 12/06/2023   CO2 26 12/06/2023   BUN 10 12/06/2023   CREATININE 1.13 12/06/2023   GFRNONAA >60 12/06/2023   CALCIUM  9.2 12/06/2023   PHOS 3.4 06/02/2022   PROT 7.0 12/06/2023   ALBUMIN 3.8 12/06/2023   LABGLOB 2.1 12/02/2022   AGRATIO 2.1 12/01/2021   BILITOT 0.6 12/06/2023   ALKPHOS 59 12/06/2023   AST 43 (H) 12/06/2023   ALT 32 12/06/2023   ANIONGAP 9 12/06/2023   Last lipids Lab Results  Component Value Date   CHOL 133 12/06/2023   HDL 60 12/06/2023   LDLCALC 60 12/06/2023   TRIG 66 12/06/2023   CHOLHDL 2.2 12/06/2023   Last hemoglobin A1c Lab Results  Component Value Date   HGBA1C 5.7 (H) 12/18/2023      The 10-year ASCVD risk score (Arnett DK, et al., 2019) is: 5.2%    Assessment & Plan:  Chronic gout of multiple sites, unspecified cause Assessment & Plan: Reports has had 2 flares of gout since the last visit. Discussed decreasing alcohol intake. Attributes flares to eating hot dogs, will work on eating less red meat. Uric acid 9.4 when checked in October, and started  on allopurinol  which he is tolerating well. Uric acid level today will titrate allopurinol  based on results.    Orders: -     Uric acid     Return in about 3 months (around 05/30/2024) for gout, htn.    Harlene Saddler, MD "

## 2024-03-01 NOTE — Assessment & Plan Note (Addendum)
 Reports has had 2 flares of gout since the last visit. Discussed decreasing alcohol intake. Attributes flares to eating hot dogs, will work on eating less red meat. Uric acid 9.4 when checked in October, and started on allopurinol  which he is tolerating well. Uric acid level today will titrate allopurinol  based on results.

## 2024-03-02 LAB — URIC ACID: Uric Acid: 5.9 mg/dL (ref 3.8–8.4)

## 2024-03-04 ENCOUNTER — Ambulatory Visit: Payer: Self-pay | Admitting: Student

## 2024-03-16 ENCOUNTER — Other Ambulatory Visit: Payer: Self-pay | Admitting: Student

## 2024-03-18 NOTE — Telephone Encounter (Signed)
 Requested Prescriptions  Pending Prescriptions Disp Refills   allopurinol  (ZYLOPRIM ) 100 MG tablet [Pharmacy Med Name: ALLOPURINOL  100 MG TABLET] 90 tablet 0    Sig: TAKE 1 TABLET BY MOUTH EVERY DAY     Endocrinology:  Gout Agents - allopurinol  Failed - 03/18/2024  9:20 AM      Failed - CBC within normal limits and completed in the last 12 months    WBC  Date Value Ref Range Status  12/06/2023 5.8 4.0 - 10.5 K/uL Final   RBC  Date Value Ref Range Status  12/06/2023 4.76 4.22 - 5.81 MIL/uL Final   Hemoglobin  Date Value Ref Range Status  12/06/2023 14.6 13.0 - 17.0 g/dL Final  89/85/7979 85.2 13.0 - 17.7 g/dL Final   HCT  Date Value Ref Range Status  12/06/2023 41.8 39.0 - 52.0 % Final   Hematocrit  Date Value Ref Range Status  12/05/2018 43.3 37.5 - 51.0 % Final   MCHC  Date Value Ref Range Status  12/06/2023 34.9 30.0 - 36.0 g/dL Final   [facility]  Date Value Ref Range Status  12/06/2023 30.7 26.0 - 34.0 pg Final   MCV  Date Value Ref Range Status  12/06/2023 87.8 80.0 - 100.0 fL Final  12/05/2018 88 79 - 97 fL Final   No results found for: PLTCOUNTKUC, LABPLAT, POCPLA RDW  Date Value Ref Range Status  12/06/2023 13.2 11.5 - 15.5 % Final  12/05/2018 13.3 11.6 - 15.4 % Final         Passed - Uric Acid in normal range and within 360 days    Uric Acid  Date Value Ref Range Status  03/01/2024 5.9 3.8 - 8.4 mg/dL Final    Comment:               Therapeutic target for gout patients: <6.0         Passed - Cr in normal range and within 360 days    Creatinine, Ser  Date Value Ref Range Status  12/06/2023 1.13 0.61 - 1.24 mg/dL Final         Passed - Valid encounter within last 12 months    Recent Outpatient Visits           2 weeks ago Chronic gout of multiple sites, unspecified cause   [place] [facility] at [facility], MD   3 months ago Annual physical exam   [facility] at  [facility], MD   5 months ago Leg edema   [place] [facility] at [facility], Harlene, MD   11 months ago OSA (obstructive sleep apnea)   [facility] at [facility] JAYSON, MD

## 2024-03-26 ENCOUNTER — Other Ambulatory Visit: Payer: Self-pay | Admitting: Student

## 2024-03-26 DIAGNOSIS — M5116 Intervertebral disc disorders with radiculopathy, lumbar region: Secondary | ICD-10-CM

## 2024-03-26 DIAGNOSIS — M109 Gout, unspecified: Secondary | ICD-10-CM

## 2024-03-27 NOTE — Telephone Encounter (Signed)
 Requested Prescriptions  Pending Prescriptions Disp Refills   indomethacin  (INDOCIN ) 25 MG capsule [Pharmacy Med Name: INDOMETHACIN  25 MG CAPSULE] 90 capsule 0    Sig: TAKE 1 CAPSULE BY MOUTH EVERY DAY     Analgesics:  NSAIDS Failed - 03/27/2024 12:08 PM      Failed - Manual Review: Labs are only required if the patient has taken medication for more than 8 weeks.      Passed - Cr in normal range and within 360 days    Creatinine, Ser  Date Value Ref Range Status  12/06/2023 1.13 0.61 - 1.24 mg/dL Final         Passed - HGB in normal range and within 360 days    Hemoglobin  Date Value Ref Range Status  12/06/2023 14.6 13.0 - 17.0 g/dL Final  89/85/7979 85.2 13.0 - 17.7 g/dL Final         Passed - PLT in normal range and within 360 days    Platelets  Date Value Ref Range Status  12/06/2023 190 150 - 400 K/uL Final  12/05/2018 209 150 - 450 x10E3/uL Final         Passed - HCT in normal range and within 360 days    HCT  Date Value Ref Range Status  12/06/2023 41.8 39.0 - 52.0 % Final   Hematocrit  Date Value Ref Range Status  12/05/2018 43.3 37.5 - 51.0 % Final         Passed - eGFR is 30 or above and within 360 days    GFR calc Af Amer  Date Value Ref Range Status  12/03/2019 93 >59 mL/min/1.73 Final    Comment:    **[facility] currently reports eGFR in compliance with the current**   recommendations of the [employer]. [facility] will   update reporting as new guidelines are published from the NKF-ASN   Task force.    GFR, Estimated  Date Value Ref Range Status  12/06/2023 >60 >60 mL/min Final    Comment:    (NOTE) Calculated using the CKD-EPI Creatinine Equation (2021)    eGFR  Date Value Ref Range Status  12/02/2022 90 >59 mL/min/1.73 Final         Passed - Patient is not pregnant      Passed - Valid encounter within last 12 months    Recent Outpatient Visits           3 weeks ago Chronic gout of multiple sites, unspecified cause   [facility] at [facility], MD   3 months ago Annual physical exam   [facility] at [facility], MD   5 months ago Leg edema   [place] [facility] at [facility], Harlene, MD   1 year ago OSA (obstructive sleep apnea)   [facility] at [facility] Cathryne JAYSON, MD

## 2024-05-31 ENCOUNTER — Ambulatory Visit: Admitting: Student

## 2024-06-20 ENCOUNTER — Ambulatory Visit: Admitting: Student
